# Patient Record
Sex: Male | Born: 1955 | Race: White | Hispanic: No | Marital: Married | Smoking: Former smoker
Health system: Southern US, Community
[De-identification: ages and names within clinical notes are randomized; demographics above are authoritative.]

## PROBLEM LIST (undated history)

## (undated) DIAGNOSIS — E538 Deficiency of other specified B group vitamins: Secondary | ICD-10-CM

## (undated) DIAGNOSIS — I209 Angina pectoris, unspecified: Secondary | ICD-10-CM

## (undated) DIAGNOSIS — G2581 Restless legs syndrome: Secondary | ICD-10-CM

## (undated) DIAGNOSIS — I10 Essential (primary) hypertension: Secondary | ICD-10-CM

## (undated) DIAGNOSIS — I499 Cardiac arrhythmia, unspecified: Secondary | ICD-10-CM

## (undated) DIAGNOSIS — E785 Hyperlipidemia, unspecified: Secondary | ICD-10-CM

## (undated) DIAGNOSIS — M479 Spondylosis, unspecified: Secondary | ICD-10-CM

## (undated) DIAGNOSIS — J302 Other seasonal allergic rhinitis: Secondary | ICD-10-CM

## (undated) DIAGNOSIS — IMO0001 Reserved for inherently not codable concepts without codable children: Secondary | ICD-10-CM

## (undated) DIAGNOSIS — E669 Obesity, unspecified: Secondary | ICD-10-CM

## (undated) DIAGNOSIS — M1990 Unspecified osteoarthritis, unspecified site: Secondary | ICD-10-CM

## (undated) DIAGNOSIS — G541 Lumbosacral plexus disorders: Secondary | ICD-10-CM

## (undated) DIAGNOSIS — IMO0002 Reserved for concepts with insufficient information to code with codable children: Secondary | ICD-10-CM

## (undated) DIAGNOSIS — R7303 Prediabetes: Secondary | ICD-10-CM

## (undated) DIAGNOSIS — K219 Gastro-esophageal reflux disease without esophagitis: Secondary | ICD-10-CM

## (undated) DIAGNOSIS — M79606 Pain in leg, unspecified: Secondary | ICD-10-CM

## (undated) DIAGNOSIS — G4730 Sleep apnea, unspecified: Secondary | ICD-10-CM

## (undated) DIAGNOSIS — R3989 Other symptoms and signs involving the genitourinary system: Secondary | ICD-10-CM

## (undated) DIAGNOSIS — G4733 Obstructive sleep apnea (adult) (pediatric): Secondary | ICD-10-CM

## (undated) DIAGNOSIS — C801 Malignant (primary) neoplasm, unspecified: Secondary | ICD-10-CM

## (undated) DIAGNOSIS — C9110 Chronic lymphocytic leukemia of B-cell type not having achieved remission: Secondary | ICD-10-CM

## (undated) DIAGNOSIS — R37 Sexual dysfunction, unspecified: Secondary | ICD-10-CM

## (undated) HISTORY — DX: Chronic lymphocytic leukemia of B-cell type not having achieved remission: C91.10

## (undated) HISTORY — DX: Lumbosacral plexus disorders: G54.1

## (undated) HISTORY — DX: Pain in leg, unspecified: M79.606

## (undated) HISTORY — DX: Deficiency of other specified B group vitamins: E53.8

## (undated) HISTORY — DX: Hyperlipidemia, unspecified: E78.5

## (undated) HISTORY — DX: Unspecified osteoarthritis, unspecified site: M19.90

## (undated) HISTORY — DX: Other seasonal allergic rhinitis: J30.2

## (undated) HISTORY — PX: NOSE SURGERY: SHX723

## (undated) HISTORY — PX: ANAL FISSURE REPAIR: SHX2312

## (undated) HISTORY — DX: Obstructive sleep apnea (adult) (pediatric): G47.33

## (undated) HISTORY — DX: Essential (primary) hypertension: I10

## (undated) HISTORY — DX: Malignant (primary) neoplasm, unspecified: C80.1

## (undated) HISTORY — PX: SKIN SURGERY: SHX2413

## (undated) HISTORY — DX: Reserved for inherently not codable concepts without codable children: IMO0001

## (undated) HISTORY — DX: Reserved for concepts with insufficient information to code with codable children: IMO0002

## (undated) HISTORY — DX: Sleep apnea, unspecified: G47.30

## (undated) HISTORY — PX: SKIN CANCER EXCISION: SHX779

## (undated) HISTORY — DX: Obesity, unspecified: E66.9

## (undated) HISTORY — DX: Spondylosis, unspecified: M47.9

## (undated) HISTORY — DX: Sexual dysfunction, unspecified: R37

## (undated) HISTORY — DX: Gastro-esophageal reflux disease without esophagitis: K21.9

## (undated) HISTORY — DX: Other symptoms and signs involving the genitourinary system: R39.89

## (undated) HISTORY — DX: Restless legs syndrome: G25.81

---

## 2001-05-24 ENCOUNTER — Other Ambulatory Visit: Admission: RE | Admit: 2001-05-24 | Discharge: 2001-05-24 | Payer: Self-pay | Admitting: Oncology

## 2001-05-28 DIAGNOSIS — C9110 Chronic lymphocytic leukemia of B-cell type not having achieved remission: Secondary | ICD-10-CM | POA: Insufficient documentation

## 2001-05-28 HISTORY — DX: Chronic lymphocytic leukemia of B-cell type not having achieved remission: C91.10

## 2004-06-08 ENCOUNTER — Ambulatory Visit: Payer: Self-pay | Admitting: Oncology

## 2004-12-16 ENCOUNTER — Ambulatory Visit: Payer: Self-pay | Admitting: Oncology

## 2005-06-02 ENCOUNTER — Ambulatory Visit: Payer: Self-pay | Admitting: Oncology

## 2005-11-17 ENCOUNTER — Ambulatory Visit: Payer: Self-pay | Admitting: Oncology

## 2006-05-04 ENCOUNTER — Ambulatory Visit: Payer: Self-pay | Admitting: Oncology

## 2006-10-19 ENCOUNTER — Ambulatory Visit: Payer: Self-pay | Admitting: Oncology

## 2009-10-05 ENCOUNTER — Ambulatory Visit: Payer: Self-pay | Admitting: Cardiovascular Disease

## 2009-10-12 ENCOUNTER — Telehealth (INDEPENDENT_AMBULATORY_CARE_PROVIDER_SITE_OTHER): Payer: Self-pay | Admitting: *Deleted

## 2009-10-13 ENCOUNTER — Ambulatory Visit: Payer: Self-pay | Admitting: Cardiology

## 2009-10-13 ENCOUNTER — Telehealth (INDEPENDENT_AMBULATORY_CARE_PROVIDER_SITE_OTHER): Payer: Self-pay | Admitting: *Deleted

## 2009-10-13 ENCOUNTER — Ambulatory Visit: Payer: Self-pay

## 2009-10-13 ENCOUNTER — Encounter (HOSPITAL_COMMUNITY): Admission: RE | Admit: 2009-10-13 | Discharge: 2009-12-01 | Payer: Self-pay | Admitting: Cardiovascular Disease

## 2009-10-14 ENCOUNTER — Ambulatory Visit: Payer: Self-pay

## 2009-10-14 ENCOUNTER — Encounter: Payer: Self-pay | Admitting: Cardiovascular Disease

## 2010-08-02 NOTE — Assessment & Plan Note (Signed)
Summary: Cardiology Nuclear Study  Nuclear Med Background Indications for Stress Test: Evaluation for Ischemia    History Comments: (6-7 yrs ago) GXT: (-) per Pt.  Symptoms: Chest Pain, Chest Pain with Exertion, DOE    Nuclear Pre-Procedure Cardiac Risk Factors: Hypertension, Lipids, Obesity Caffeine/Decaff Intake: None NPO After: 7:00 PM Lungs: clear IV 0.9% NS with Angio Cath: 22g     IV Site: (L) AC IV Started by: Irean Hong RN Chest Size (in) 56     Height (in): 75 Weight (lb): 348 BMI: 43.65  Nuclear Med Study 1 or 2 day study:  1 day     Stress Test Type:  Stress Reading MD:  Olga Millers, MD     Referring MD:  S.Allen Resting Radionuclide:  Technetium 33m Tetrofosmin     Resting Radionuclide Dose:  33 mCi  Stress Radionuclide:  Technetium 36m Tetrofosmin     Stress Radionuclide Dose:  33 mCi   Stress Protocol Exercise Time (min):  6:45 min     Max HR:  153 bpm     Predicted Max HR:  169 bpm  Max Systolic BP: 179 mm Hg     Percent Max HR:  90.53 %     METS: 8.0 Rate Pressure Product:  47829    Stress Test Technologist:  Milana Na EMT-P     Nuclear Technologist:  Domenic Polite CNMT  Rest Procedure  Myocardial perfusion imaging was performed at rest 45 minutes following the intravenous administration of Myoview Technetium 21m Tetrofosmin.  Stress Procedure  The patient exercised for 6:45. The patient stopped due to fatigue, doe, and denied any chest pain.  There were no significant ST-T wave changes.  Myoview was injected at peak exercise and myocardial perfusion imaging was performed after a brief delay.  QPS Raw Data Images:  There is no interference from other nuclear activity. Stress Images:  There is decreased uptake in the inferior wall. Rest Images:  There is decreased uptake in the inferior wall less prominent compared to the stress images. Subtraction (SDS):  These findings are consistent with inferior thinning and mild inferior  ischemia. Transient Ischemic Dilatation:  .86  (Normal <1.22)  Lung/Heart Ratio:  .43  (Normal <0.45)  Quantitative Gated Spect Images QGS EDV:  153 ml QGS ESV:  60 ml QGS EF:  61 % QGS cine images:  Normal wall motion; mild left ventricular enlargement.   Overall Impression  Exercise Capacity: Fair exercise capacity. BP Response: Normal blood pressure response. Clinical Symptoms: There is dyspnea. ECG Impression: No significant ST segment change suggestive of ischemia. Overall Impression: Low risk stress nuclear study with inferior thinning and mild inferior ischemia.

## 2010-08-02 NOTE — Progress Notes (Signed)
----   Converted from flag ---- ---- 10/12/2009 4:49 PM, Marilynne Halsted, CMA, AAMA wrote: Richardson Landry TEST 10-13-09, [employer], MED[employer]ATCH APPROVED CASE#[id number], EXP 10-27-09 ------------------------------

## 2010-08-02 NOTE — Progress Notes (Signed)
Summary: Nuclear Pre-Procedure  Phone Note Outgoing Call Call back at [facility] Phone [phone]   Call placed by: Stanton Kidney, EMT-P,  October 12, 2009 3:04 PM Action Taken: Phone Call Completed Summary of Call: Left message with information on Myoview Information Sheet (see scanned document for details).     Nuclear Med Background Indications for Stress Test: Evaluation for Ischemia    History Comments: (6-7 yrs ago) GXT: (-) per Pt.  Symptoms: Chest Pain, Chest Pain with Exertion    Nuclear Pre-Procedure Cardiac Risk Factors: Hypertension, Lipids, Obesity

## 2010-11-15 NOTE — Letter (Signed)
October 05, 2009    Lucila Maine, MD.  [facility].  [address].  [place], [place], [address].   RE:  SHERON, ROBIN  MRN:  [id number]  /  DOB:  [birth year]   Dear Dr. Lorin Picket:   I had the pleasure of seeing Mr. Lierman in clinic this afternoon.  As  you know, he is a 55 year old male with multiple risk factors for  coronary disease, who has been experiencing some intermittent exertional  chest discomfort that has mostly atypical characteristics.  However, the  fact that it is exertional raises a concern for obstructive coronary  disease.  We will proceed with an exercise Cardiolite study in order to  rule out inducible ischemia.  I also spoke with the patient at length  about lifestyle modifications that would be certainly beneficial to him.  He appears motivated as he has recently lost 11 pounds through dietary  modification.  I will let you know the result of the stress test.  I  look forward to following this patient along with you and please contact  my office if I can be of further assistance.    Sincerely,      Brayton El, MD  Electronically Signed    SGA/MedQ  DD: 10/05/2009  DT: 10/05/2009  Job #: [id number]

## 2010-11-15 NOTE — Assessment & Plan Note (Signed)
[facility]                        [facility] OFFICE NOTE   NAME:Justin Cross, Justin Cross                   MRN:          [id number]  DATE:10/05/2009                            DOB:          10-Sep-1955    CHIEF COMPLAINT:  Chest pain.   HISTORY OF PRESENT ILLNESS:  Justin Cross is a 55 year old white male with  past medical history significant for CLL, hypertension, dyslipidemia,  obesity, GERD, who is presenting with 59-month history of chest  discomfort.  For the past several months, he has had some substernal  chest discomfort that usually occurs with exertion and relieved by a  burping.  He believes that it is related to him eating certain types of  foods and drinking carbonated drinks.  The chest discomfort does not  radiate and does not have any associated symptoms.  He walks with his  dog 15-20 minutes daily and has not noticed any change in dyspnea,  although his physical conditioning is suboptimal.  He does not have any  chest discomfort at rest.  He states he had a negative stress test 6 or  7 years ago.  Other than the chest discomfort, the patient has been  getting along fairly well.  He has lost 11 pounds in the past month or 2  by making any changes to his dietary intake.   PAST MEDICAL HISTORY:  As above in the HPI.   SOCIAL HISTORY:  No tobacco, no alcohol.   FAMILY HISTORY:  Negative for premature coronary artery disease.   ALLERGIES:  DAYPRO causes rash.   MEDICATIONS:  1. Aspirin 162 mg occasionally.  2. Pravastatin 40 mg p.o. nightly.  3. Diovan HCT 160/12.5 p.o. daily.  4. Omeprazole 40 mg daily.  5. Vitamin C.  6. Benefiber.  7. Metaxalone.  8. Flax seed.  9. Zinc.  10.Azelastine nasal spray.   REVIEW OF SYSTEMS:  As in HPI.  He also endorses occasional anxiety for  which he takes alprazolam.  Other systems as in HPI, otherwise negative.   PHYSICAL EXAMINATION:  VITAL SIGNS:  Blood pressure 112/75, pulse 74,  sating 96% on room air, and he weighs 350.  GENERAL:  No acute distress.  HEENT:  Normocephalic and atraumatic.  NECK:  Supple.  There is no JVD.  There are no carotid bruits.  HEART:  Regular rate and rhythm without murmur or gallop.  LUNGS:  Clear bilaterally.  ABDOMEN:  Soft, nontender, and nondistended.  EXTREMITIES:  Without edema.  SKIN:  Warm and dry.  PSYCH:  The patient is appropriate with normal levels of insight.  NEURO:  Appears nonfocal.  MUSCULOSKELETAL:  5/5 bilateral upper and lower extremity strength.   EKG taken today in clinic independently interpreted myself demonstrates  normal sinus rhythm with some nonspecific T-wave abnormalities.   LABORATORY DATA:  Review of his CMP is completely within normal limits  except for an elevated glucose at 117, his creatinine was 0.9.  Review  of his CBC showed white count of 26.2, hemoglobin of 15, hematocrit of  46, and platelet count 193.  Review of the cholesterol, total  cholesterol 207, HDL  27, triglycerides 210, and LDL 138.   ASSESSMENT:  A 55 year old white male with hypertension, hyperlipidemia,  obesity, presenting with atypical chest discomfort.  I believe it is  more likely that the discomfort is related to his diagnosis of reflux  and dyspepsia; however, the exertional component does raise concern for  coronary artery disease.  His blood pressure is currently well  controlled.   PLAN:  We will proceed with an exercise Cardiolite to rule out inducible  ischemia and to ensure normal left ventricular systolic function.  The  patient may continue on his above medications.  The patient is counseled  at length regarding beneficial changes that could be made to his diet  and he seems willing to incorporate these.  We will contact the patient  after results of his stress test are obtained.     Brayton El, MD  Electronically Signed    SGA/MedQ  DD: 10/05/2009  DT: 10/06/2009  Job #: [id number]

## 2011-03-23 ENCOUNTER — Encounter: Payer: Self-pay | Admitting: Cardiovascular Disease

## 2015-07-16 DIAGNOSIS — C9110 Chronic lymphocytic leukemia of B-cell type not having achieved remission: Secondary | ICD-10-CM

## 2016-07-14 DIAGNOSIS — N503 Cyst of epididymis: Secondary | ICD-10-CM

## 2016-07-14 DIAGNOSIS — C9110 Chronic lymphocytic leukemia of B-cell type not having achieved remission: Secondary | ICD-10-CM

## 2017-01-19 DIAGNOSIS — C9110 Chronic lymphocytic leukemia of B-cell type not having achieved remission: Secondary | ICD-10-CM | POA: Diagnosis not present

## 2017-01-19 DIAGNOSIS — R590 Localized enlarged lymph nodes: Secondary | ICD-10-CM | POA: Diagnosis not present

## 2017-07-27 DIAGNOSIS — C9110 Chronic lymphocytic leukemia of B-cell type not having achieved remission: Secondary | ICD-10-CM | POA: Diagnosis not present

## 2017-10-22 DIAGNOSIS — D485 Neoplasm of uncertain behavior of skin: Secondary | ICD-10-CM | POA: Diagnosis not present

## 2017-10-22 DIAGNOSIS — L570 Actinic keratosis: Secondary | ICD-10-CM | POA: Diagnosis not present

## 2017-10-22 DIAGNOSIS — D2239 Melanocytic nevi of other parts of face: Secondary | ICD-10-CM | POA: Diagnosis not present

## 2017-10-22 DIAGNOSIS — Z125 Encounter for screening for malignant neoplasm of prostate: Secondary | ICD-10-CM | POA: Diagnosis not present

## 2017-10-22 DIAGNOSIS — Z0000 Encounter for general adult medical examination without abnormal findings: Secondary | ICD-10-CM | POA: Diagnosis not present

## 2017-10-22 DIAGNOSIS — C44319 Basal cell carcinoma of skin of other parts of face: Secondary | ICD-10-CM | POA: Diagnosis not present

## 2017-10-22 DIAGNOSIS — E782 Mixed hyperlipidemia: Secondary | ICD-10-CM | POA: Diagnosis not present

## 2017-10-22 DIAGNOSIS — L578 Other skin changes due to chronic exposure to nonionizing radiation: Secondary | ICD-10-CM | POA: Diagnosis not present

## 2017-10-22 DIAGNOSIS — D225 Melanocytic nevi of trunk: Secondary | ICD-10-CM | POA: Diagnosis not present

## 2017-10-25 DIAGNOSIS — C9190 Lymphoid leukemia, unspecified not having achieved remission: Secondary | ICD-10-CM | POA: Diagnosis not present

## 2017-10-25 DIAGNOSIS — E119 Type 2 diabetes mellitus without complications: Secondary | ICD-10-CM | POA: Diagnosis not present

## 2017-11-06 DIAGNOSIS — C44519 Basal cell carcinoma of skin of other part of trunk: Secondary | ICD-10-CM | POA: Diagnosis not present

## 2017-12-10 DIAGNOSIS — C44712 Basal cell carcinoma of skin of right lower limb, including hip: Secondary | ICD-10-CM | POA: Diagnosis not present

## 2017-12-10 DIAGNOSIS — C44519 Basal cell carcinoma of skin of other part of trunk: Secondary | ICD-10-CM | POA: Diagnosis not present

## 2018-01-01 DIAGNOSIS — L03114 Cellulitis of left upper limb: Secondary | ICD-10-CM | POA: Diagnosis not present

## 2018-02-01 DIAGNOSIS — C9110 Chronic lymphocytic leukemia of B-cell type not having achieved remission: Secondary | ICD-10-CM | POA: Diagnosis not present

## 2018-04-26 DIAGNOSIS — R7301 Impaired fasting glucose: Secondary | ICD-10-CM | POA: Diagnosis not present

## 2018-04-29 DIAGNOSIS — D225 Melanocytic nevi of trunk: Secondary | ICD-10-CM | POA: Diagnosis not present

## 2018-04-29 DIAGNOSIS — E119 Type 2 diabetes mellitus without complications: Secondary | ICD-10-CM | POA: Diagnosis not present

## 2018-04-29 DIAGNOSIS — D2239 Melanocytic nevi of other parts of face: Secondary | ICD-10-CM | POA: Diagnosis not present

## 2018-04-29 DIAGNOSIS — C44619 Basal cell carcinoma of skin of left upper limb, including shoulder: Secondary | ICD-10-CM | POA: Diagnosis not present

## 2018-04-29 DIAGNOSIS — Z6839 Body mass index (BMI) 39.0-39.9, adult: Secondary | ICD-10-CM | POA: Diagnosis not present

## 2018-04-29 DIAGNOSIS — L570 Actinic keratosis: Secondary | ICD-10-CM | POA: Diagnosis not present

## 2018-04-29 DIAGNOSIS — L814 Other melanin hyperpigmentation: Secondary | ICD-10-CM | POA: Diagnosis not present

## 2018-05-23 DIAGNOSIS — C44619 Basal cell carcinoma of skin of left upper limb, including shoulder: Secondary | ICD-10-CM | POA: Diagnosis not present

## 2018-06-13 DIAGNOSIS — J069 Acute upper respiratory infection, unspecified: Secondary | ICD-10-CM | POA: Diagnosis not present

## 2018-06-13 DIAGNOSIS — Z6841 Body Mass Index (BMI) 40.0 and over, adult: Secondary | ICD-10-CM | POA: Diagnosis not present

## 2018-06-13 DIAGNOSIS — B9789 Other viral agents as the cause of diseases classified elsewhere: Secondary | ICD-10-CM | POA: Diagnosis not present

## 2018-08-05 DIAGNOSIS — C9110 Chronic lymphocytic leukemia of B-cell type not having achieved remission: Secondary | ICD-10-CM | POA: Diagnosis not present

## 2018-08-08 DIAGNOSIS — J40 Bronchitis, not specified as acute or chronic: Secondary | ICD-10-CM | POA: Diagnosis not present

## 2018-08-08 DIAGNOSIS — K219 Gastro-esophageal reflux disease without esophagitis: Secondary | ICD-10-CM | POA: Diagnosis not present

## 2018-08-08 DIAGNOSIS — Z6841 Body Mass Index (BMI) 40.0 and over, adult: Secondary | ICD-10-CM | POA: Diagnosis not present

## 2018-08-08 DIAGNOSIS — J329 Chronic sinusitis, unspecified: Secondary | ICD-10-CM | POA: Diagnosis not present

## 2018-10-28 DIAGNOSIS — Z125 Encounter for screening for malignant neoplasm of prostate: Secondary | ICD-10-CM | POA: Diagnosis not present

## 2018-10-28 DIAGNOSIS — Z131 Encounter for screening for diabetes mellitus: Secondary | ICD-10-CM | POA: Diagnosis not present

## 2018-10-28 DIAGNOSIS — Z0000 Encounter for general adult medical examination without abnormal findings: Secondary | ICD-10-CM | POA: Diagnosis not present

## 2018-10-28 DIAGNOSIS — Z13220 Encounter for screening for lipoid disorders: Secondary | ICD-10-CM | POA: Diagnosis not present

## 2019-11-06 DIAGNOSIS — C9110 Chronic lymphocytic leukemia of B-cell type not having achieved remission: Secondary | ICD-10-CM

## 2019-12-30 ENCOUNTER — Encounter: Payer: Self-pay | Admitting: Neurology

## 2019-12-30 ENCOUNTER — Ambulatory Visit: Payer: Commercial Managed Care - PPO | Admitting: Neurology

## 2019-12-30 ENCOUNTER — Other Ambulatory Visit: Payer: Self-pay

## 2019-12-30 VITALS — BP 120/71 | HR 64 | Ht 74.5 in | Wt 327.0 lb

## 2019-12-30 DIAGNOSIS — G8929 Other chronic pain: Secondary | ICD-10-CM | POA: Diagnosis not present

## 2019-12-30 DIAGNOSIS — M62838 Other muscle spasm: Secondary | ICD-10-CM

## 2019-12-30 DIAGNOSIS — G2581 Restless legs syndrome: Secondary | ICD-10-CM | POA: Diagnosis not present

## 2019-12-30 DIAGNOSIS — M5441 Lumbago with sciatica, right side: Secondary | ICD-10-CM

## 2019-12-30 DIAGNOSIS — M5440 Lumbago with sciatica, unspecified side: Secondary | ICD-10-CM

## 2019-12-30 HISTORY — DX: Other muscle spasm: M62.838

## 2019-12-30 HISTORY — DX: Lumbago with sciatica, right side: M54.41

## 2019-12-30 HISTORY — DX: Other chronic pain: G89.29

## 2019-12-30 MED ORDER — GABAPENTIN 100 MG PO CAPS: 300.0000 mg | ORAL_CAPSULE | Freq: Every day | ORAL | 11 refills | Status: DC

## 2019-12-30 MED ORDER — ALPRAZOLAM 1 MG PO TABS: ORAL_TABLET | ORAL | 0 refills | Status: DC

## 2019-12-30 NOTE — Progress Notes (Signed)
HISTORICAL  Justin Cross is a 64 year old male, seen in request by his primary care physician Dr. Venetia Maxon, Harrell Gave for evaluation of bilateral lower extremity deep achy pain, muscle spasm.  Initial evaluation was on December 30, 2019  I reviewed and summarized the referring note. He has past medical history of HTN HLD has been taking pravstatin 40mg  qhs for more than 10 years, He also carries a diagnosis of chronic lymphoblastic leukemia, it was not incidental finding by laboratory evaluation more than 15 years ago, he has enlarged cervical lymph node, never received any treatment  History of left lumbosacral plexopathy pain 2003, he presented with acute onset left lower extremity deep achy pain, muscle weakness, the diagnosis was confirmed by electrodiagnostic study by Dr. Metta Clines, his left lower extremity weakness last about 6 months, now he regained most of the function  He reported long history of intermittent lower extremity deep achy pain, symptom onset was more than 20 years ago, after prolonged sitting on the couch, when he got up, he had a sudden onset severe achiness at left inner thigh muscle, is different from the muscle spasm, he has play sports when he was young, he described muscle spasm as sudden onset muscle tightness as a knot, ultimately improved by stretching, he did experience frequent muscle spasm involving bilateral calf, inner thigh muscle, and posterior neck muscle in the past  This particular pain was described as it did achiness, he has to bend over, to wait for [place] to pass, is usually last about 10 minutes, since the initial event, he had frequent similar recurrence, involving different area of lower extremity, oftentimes it is bilateral inner thigh area, posterior upper thigh, lateral leg,  He denies persistent sensory loss, gait abnormality, no bowel and bladder incontinence, for a while, he was having 1 spell at least once every night, he was put on gabapentin  around 2011, in the yard for extended period of time, playing golf, dehydration, his symptoms quickly improved by taking 300 mg every night, higher dose would cause excessive drowsiness  His symptoms overall is under reasonable management until he received Kenalog shot in May 2021, he began to have significant flareup of bilateral lower extremity intermittent deep aching episode, wake him up again almost every night, he tried higher dose of gabapentin, could not not tolerated due to sleepiness  He does have morbid obesity, chronic low back pain,    REVIEW OF SYSTEMS: Full 14 system review of systems performed and notable only for as above All other review of systems were negative.  ALLERGIES: Allergies  Allergen Reactions   Daypro [Oxaprozin] Rash    HOME MEDICATIONS: Current Outpatient Medications  Medication Sig Dispense Refill   Ascorbic Acid (VITAMIN C PO) Take by mouth.       AZELASTINE HCL NA Place into the nose.       Flaxseed, Linseed, (FLAX SEEDS PO) Take by mouth.       gabapentin (NEURONTIN) 300 MG capsule Take 300 mg by mouth 3 (three) times daily. 12/31/19 - He always takes 2 capsules QHS and occasionally one extra capsule in the evenings.     METAXALONE PO Take by mouth.       Multiple Vitamins-Minerals (ZINC PO) Take by mouth.       omeprazole (PRILOSEC) 40 MG capsule Take 40 mg by mouth daily.       pravastatin (PRAVACHOL) 40 MG tablet Take 40 mg by mouth daily.       traMADol (ULTRAM) 50  MG tablet Take 50 mg by mouth as needed.     valsartan-hydrochlorothiazide (DIOVAN-HCT) 160-12.5 MG per tablet Take 1 tablet by mouth daily.       Wheat Dextrin (BENEFIBER PO) Take by mouth.       No current facility-administered medications for this visit.    PAST MEDICAL HISTORY: Past Medical History:  Diagnosis Date   B12 deficiency    Cancer ([place])    Chest pain    CLL (chronic lymphoblastic leukemia)    Constipation    Diabetes mellitus    Dyslipidemia     GERD (gastroesophageal reflux disease)    High blood pressure    Hyperlipidemia    Leg pain    Lumbosacral plexopathy    12/30/19 - estimates 18-20 years   Obesity    OSA (obstructive sleep apnea)    Reflux    Restless leg    Seasonal allergies    Hay fever   Sexual dysfunction    Sleep apnea    Ulcer    Urinary problem     PAST SURGICAL HISTORY: Past Surgical History:  Procedure Laterality Date   [place]?   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   Left-handed.   Caffeine: 1/3 of a 2-liter bottle of Diet Mountain Dew per day.   Social Determinants of Health   Financial Resource Strain:    Difficulty of Paying Living Expenses:   Food Insecurity:    Worried About Charity fundraiser in the Last Year:    Arboriculturist in the Last Year:   Transportation Needs:    Film/video editor (Medical):    Lack of Transportation (Non-Medical):   Physical Activity:    Days of Exercise per Week:    Minutes of Exercise per Session:   Stress:    Feeling of Stress :   Social Connections:    Frequency of Communication with Friends and  Family:    Frequency of Social Gatherings with Friends and Family:    Attends Religious Services:    Active Member of Clubs or Organizations:    Attends Archivist Meetings:    Marital Status:   Intimate Partner Violence:    Fear of Current or Ex-Partner:    Emotionally Abused:    Physically Abused:    Sexually Abused:      PHYSICAL EXAM   Vitals:   12/30/19 1415  BP: 120/71  Pulse: 64  Weight: (!) 327 lb (148.3 kg)  Height: 6' 2.5" (1.892 m)  Not recorded     Body mass index is 41.42 kg/m.  PHYSICAL EXAMNIATION:  Gen: NAD, conversant, well nourised, well groomed                     Cardiovascular: Regular rate rhythm, no peripheral edema, warm, nontender. Eyes: Conjunctivae clear without exudates or hemorrhage Neck: Supple, no carotid bruits. Pulmonary: Clear to auscultation bilaterally   NEUROLOGICAL EXAM:  MENTAL STATUS: Speech:    Speech is normal; fluent and spontaneous with normal comprehension.  Cognition:     Orientation to time, place and person     Normal recent and remote memory     Normal Attention span and concentration     Normal Language, naming, repeating,spontaneous speech     Fund of knowledge   CRANIAL NERVES: CN II: Visual fields are full to confrontation. Pupils are round equal and briskly reactive to light. CN III, IV, VI: extraocular movement are normal. No ptosis. CN V: Facial sensation is intact to light touch CN VII: Face is symmetric with normal eye closure  CN VIII: Hearing is normal to causal conversation. CN IX, X: Phonation is normal. CN XI: Head turning and shoulder shrug are intact  MOTOR: There is no pronator drift of out-stretched arms. Muscle bulk and tone are normal. Muscle strength is normal.  REFLEXES: Reflexes are 2+ and symmetric at the biceps, triceps, knees, and ankles. Plantar responses are flexor.  SENSORY: Intact to light touch, pinprick and vibratory sensation are intact in fingers and  toes.  COORDINATION: There is no trunk or limb dysmetria noted.  GAIT/STANCE: Steady, but limited due to his body habitus   DIAGNOSTIC DATA (LABS, IMAGING, TESTING) - I reviewed patient records, labs, notes, testing and imaging myself where available.   ASSESSMENT AND PLAN  Justin Cross is a 64 y.o. male   Intermittent bilateral lower extremity muscle achy pain, Chronic low back pain  Differentiation diagnosis include bilateral lumbosacral radiculopathy, versus musculoskeletal etiology, electrolyte imbalance  Laboratory evaluations  MRI of lumbar spine  May take higher dose of gabapentin -100 mg prescription, adding on his 300 mg tablets   Marcial Pacas, M.D. Ph.D.  [facility] [address], [place], [place] [address] Ph: [phone] Fax: [phone]  CC:  Street, Sharon Mt, MD

## 2019-12-31 LAB — COMPREHENSIVE METABOLIC PANEL
ALT: 22 IU/L (ref 0–44)
AST: 20 IU/L (ref 0–40)
Albumin/Globulin Ratio: 2.6 — ABNORMAL HIGH (ref 1.2–2.2)
Albumin: 4.6 g/dL (ref 3.8–4.8)
Alkaline Phosphatase: 48 IU/L (ref 48–121)
BUN/Creatinine Ratio: 13 (ref 10–24)
BUN: 14 mg/dL (ref 8–27)
Bilirubin Total: 0.5 mg/dL (ref 0.0–1.2)
CO2: 24 mmol/L (ref 20–29)
Calcium: 9 mg/dL (ref 8.6–10.2)
Chloride: 101 mmol/L (ref 96–106)
Creatinine, Ser: 1.06 mg/dL (ref 0.76–1.27)
GFR calc Af Amer: 87 mL/min/{1.73_m2} (ref >59)
GFR calc non Af Amer: 75 mL/min/{1.73_m2} (ref >59)
Globulin, Total: 1.8 g/dL (ref 1.5–4.5)
Glucose: 125 mg/dL — ABNORMAL HIGH (ref 65–99)
Potassium: 4 mmol/L (ref 3.5–5.2)
Sodium: 138 mmol/L (ref 134–144)
Total Protein: 6.4 g/dL (ref 6.0–8.5)

## 2019-12-31 LAB — CBC WITH DIFFERENTIAL
Basophils Absolute: 0.1 10*3/uL (ref 0.0–0.2)
Basos: 0 %
EOS (ABSOLUTE): 0.1 10*3/uL (ref 0.0–0.4)
Eos: 0 %
Hematocrit: 44.8 % (ref 37.5–51.0)
Hemoglobin: 14.9 g/dL (ref 13.0–17.7)
Immature Grans (Abs): 0 10*3/uL (ref 0.0–0.1)
Immature Granulocytes: 0 %
Lymphocytes Absolute: 22.2 10*3/uL — ABNORMAL HIGH (ref 0.7–3.1)
Lymphs: 76 %
MCH: 29.6 pg (ref 26.6–33.0)
MCHC: 33.3 g/dL (ref 31.5–35.7)
MCV: 89 fL (ref 79–97)
Monocytes Absolute: 0.8 10*3/uL (ref 0.1–0.9)
Monocytes: 3 %
Neutrophils Absolute: 6.2 10*3/uL (ref 1.4–7.0)
Neutrophils: 21 %
RBC: 5.04 x10E6/uL (ref 4.14–5.80)
RDW: 13 % (ref 11.6–15.4)
WBC: 29.4 10*3/uL (ref 3.4–10.8)

## 2019-12-31 LAB — ANA W/REFLEX IF POSITIVE
Anti JO-1: <0.2 AI (ref 0.0–0.9)
Anti Nuclear Antibody (ANA): POSITIVE — AB
Centromere Ab Screen: <0.2 AI (ref 0.0–0.9)
Chromatin Ab SerPl-aCnc: <0.2 AI (ref 0.0–0.9)
ENA RNP Ab: 1.1 AI — ABNORMAL HIGH (ref 0.0–0.9)
ENA SM Ab Ser-aCnc: <0.2 AI (ref 0.0–0.9)
ENA SSA (RO) Ab: <0.2 AI (ref 0.0–0.9)
ENA SSB (LA) Ab: <0.2 AI (ref 0.0–0.9)
Scleroderma (Scl-70) (ENA) Antibody, IgG: <0.2 AI (ref 0.0–0.9)
dsDNA Ab: 3 IU/mL (ref 0–9)

## 2019-12-31 LAB — C-REACTIVE PROTEIN: CRP: 1 mg/L (ref 0–10)

## 2019-12-31 LAB — FERRITIN: Ferritin: 74 ng/mL (ref 30–400)

## 2019-12-31 LAB — TSH: TSH: 1.99 u[IU]/mL (ref 0.450–4.500)

## 2019-12-31 LAB — RPR: RPR Ser Ql: NONREACTIVE

## 2019-12-31 LAB — VITAMIN B12: Vitamin B-12: 498 pg/mL (ref 232–1245)

## 2019-12-31 LAB — FOLATE: Folate: >20 ng/mL (ref >3.0)

## 2019-12-31 LAB — SEDIMENTATION RATE: Sed Rate: 2 mm/hr (ref 0–30)

## 2019-12-31 LAB — HIV ANTIBODY (ROUTINE TESTING W REFLEX): HIV Screen 4th Generation wRfx: NONREACTIVE

## 2019-12-31 LAB — HGB A1C W/O EAG: Hgb A1c MFr Bld: 6.7 % — ABNORMAL HIGH (ref 4.8–5.6)

## 2019-12-31 LAB — CK: Total CK: 227 U/L (ref 41–331)

## 2020-01-01 ENCOUNTER — Telehealth: Payer: Self-pay | Admitting: Neurology

## 2020-01-01 NOTE — Telephone Encounter (Signed)
Please call patient laboratory evaluation showed  1, elevated A1c 6.7, made the diagnosis of diabetes, he should start with diet control, moderate exercise, I have faxed the laboratory evaluation to his primary care Street, Sharon Mt, MD, he should contact him for management  2.  Elevated WBC 29.4, consistent with his diagnosis of CLL, I do not have previous lab for comparison  3.  Positive ANA, with low titer ENA RNP antibody has unknown clinical significance,  Rest of the laboratory evaluation showed no significant abnormalities.

## 2020-01-01 NOTE — Telephone Encounter (Signed)
I have spoken to the patient and provided him with the lab results below. He is agreeable to watch his diet and start exercising. He is going to contact his PCP, Dr. Venetia Maxon, to make an appt for follow up.

## 2020-01-08 ENCOUNTER — Telehealth: Payer: Self-pay | Admitting: Neurology

## 2020-01-08 NOTE — Telephone Encounter (Signed)
pt scheduled for 01/14/20

## 2020-01-08 NOTE — Telephone Encounter (Signed)
open MRI  [facility] Auth: [id number] (exp. 01/08/20 to 02/06/20) order faxed to [facility] imagin they will reach out to the patient to schedule.

## 2020-01-15 ENCOUNTER — Telehealth: Payer: Self-pay | Admitting: Neurology

## 2020-01-15 NOTE — Telephone Encounter (Signed)
I received the call from [facility] radiology  Multiple T2 hyperintensity lesion at bilateral kidney, heterogeneous in intensity, recommended CT or ultrasound of abdomen for further evaluation  We will repeat and review formal MRI lumbar report before proceed

## 2020-01-15 NOTE — Telephone Encounter (Signed)
INDICATION: Other muscle spasm. Bilateral leg pain. Symptoms for years.   STUDY: MRI of the lumbar spine without intravenous contrast performed on 01/14/2020 4:38 PM.   COMPARISON: No comparison available.   TECHNIQUE: Multiplanar, multisequence MR imaging obtained through the lumbar spine without contrast on 01/14/2020 4:38 PM.   CONTRAST: None.   FINDINGS:  # Osseous structures: Small anterolateral osteophyte appreciated at L1-L2. Vertebral body heights are maintained. No acute fracture or concerning marrow signal abnormality.  # Alignment:Alignment maintained.  # Conus medullaris/cauda equina: Spinal cord signal is normal and terminates at T12-L1. The cauda equina is unremarkable.   # Lower thoracic spine: No significant spinal canal or foraminal stenosis. This is viewed on the sagittal images only.   # T12-L1: Small RIGHT paracentral disc protrusion effacing the ventral thecal sac. Foramen are patent. This is viewed on the sagittal images only.  # L1-L2: Disc desiccation and a disc bulge. There is mild effacement of the ventral thecal sac. Foramen are patent. This is viewed on the sagittal images only.  # L2-L3: Disc desiccation with a small disc bulge. There is mild effacement of the ventral thecal sac. Minimal narrowing of the foramen.  # L3-L4: Disc desiccation with a disc bulge. This produces mild spinal canal/lateral recess narrowing. Minimal narrowing of the neural foramen.  # L4-L5: Disc desiccation with a small central disc protrusion. Spinal canal and foramen are patent.  # L5-S1: Disc desiccation with a RIGHT paracentral disc osteophytic complex that abuts and displaces the traversing RIGHT S1 nerve root. Mild facet hypertrophy, greater on the RIGHT. There is mild effacement of RIGHT anterior aspect of the thecal sac and mild RIGHT neuroforaminal stenosis. The LEFT neuroforamen is patent.   # Paraspinal tissues: Unremarkable.   # Additional comments: Multiple  hyperintense T2-weighted lesions are seen in both kidneys. Of note, there are more heterogeneous on the LEFT. The largest measures 5 x 5.3 cm. Procedure Note  Acute Interface, Incoming Rad Results - 01/15/2020 10:16 AM EDT  INDICATION: Other muscle spasm. Bilateral leg pain. Symptoms for years.   STUDY: MRI of the lumbar spine without intravenous contrast performed on 01/14/2020 4:38 PM.   COMPARISON: No comparison available.   TECHNIQUE: Multiplanar, multisequence MR imaging obtained through the lumbar spine without contrast on 01/14/2020 4:38 PM.   CONTRAST: None.   FINDINGS:  # Osseous structures: Small anterolateral osteophyte appreciated at L1-L2. Vertebral body heights are maintained. No acute fracture or concerning marrow signal abnormality.  # Alignment:Alignment maintained.  # Conus medullaris/cauda equina: Spinal cord signal is normal and terminates at T12-L1. The cauda equina is unremarkable.   # Lower thoracic spine: No significant spinal canal or foraminal stenosis. This is viewed on the sagittal images only.   # T12-L1: Small RIGHT paracentral disc protrusion effacing the ventral thecal sac. Foramen are patent. This is viewed on the sagittal images only.  # L1-L2: Disc desiccation and a disc bulge. There is mild effacement of the ventral thecal sac. Foramen are patent. This is viewed on the sagittal images only.  # L2-L3: Disc desiccation with a small disc bulge. There is mild effacement of the ventral thecal sac. Minimal narrowing of the foramen.  # L3-L4: Disc desiccation with a disc bulge. This produces mild spinal canal/lateral recess narrowing. Minimal narrowing of the neural foramen.  # L4-L5: Disc desiccation with a small central disc protrusion. Spinal canal and foramen are patent.  # L5-S1: Disc desiccation with a RIGHT paracentral disc osteophytic complex that abuts and displaces  the traversing RIGHT S1 nerve root. Mild facet hypertrophy, greater on the  RIGHT. Thereis mild effacement of RIGHT anterior aspect of the thecal sac and mild RIGHT neuroforaminal stenosis. The LEFT neuroforamen is patent.   # Paraspinal tissues: Unremarkable.   # Additional comments: Multiple hyperintense T2-weighted lesions are seen in both kidneys. Of note, there are more heterogeneous on the LEFT. The largest measures 5 x 5.3 cm.    IMPRESSION:  1. 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 This is most notable for a RIGHT paracentral disc osteophytic complex at L5-S1 that produces mass effect upon the traversing RIGHT S1 nerve root.  2. Multiple hyperintense T2-weighted lesions in both kidneys that are more heterogeneous and larger on the LEFT. These do not represent simple cysts. Recommend renal ultrasound or renal CT for further evaluation.   ####CODE SIGNIFICANT REPORT####. IMPRESSION #2  (automated ordering provider notification pathway initiated at 01/15/2020 10:14 AM)     I have personally reviewed, and call patient about MRI lumbar, multilevel degenerative changes, most noticeable L5-S1, right paracentral disc herniation, with evidence of right S1 nerve root compression  Multiple hypodensity T2 at both kidney, heterogeneous, larger on the left, this does not represent simple cyst, I have suggested him further evaluation with CT of abdomen, he preferred to coordinated through his primary care, I have forward this note to his [place], Harrell Gave he will also call Dr. Venetia Maxon early next week.

## 2020-03-01 ENCOUNTER — Ambulatory Visit: Payer: Commercial Managed Care - PPO | Admitting: Neurology

## 2020-03-01 ENCOUNTER — Encounter: Payer: Self-pay | Admitting: Neurology

## 2020-03-01 ENCOUNTER — Other Ambulatory Visit: Payer: Self-pay

## 2020-03-01 VITALS — BP 125/75 | HR 66 | Ht 74.5 in | Wt 327.5 lb

## 2020-03-01 DIAGNOSIS — G8929 Other chronic pain: Secondary | ICD-10-CM

## 2020-03-01 DIAGNOSIS — G2581 Restless legs syndrome: Secondary | ICD-10-CM

## 2020-03-01 DIAGNOSIS — M5440 Lumbago with sciatica, unspecified side: Secondary | ICD-10-CM | POA: Diagnosis not present

## 2020-03-01 DIAGNOSIS — M62838 Other muscle spasm: Secondary | ICD-10-CM

## 2020-03-01 HISTORY — DX: Restless legs syndrome: G25.81

## 2020-03-01 NOTE — Progress Notes (Signed)
HISTORICAL  Justin Cross is a 64 year old male, seen in request by his primary care physician Dr. Venetia Maxon, Justin Cross for evaluation of bilateral lower extremity deep achy pain, muscle spasm.  Initial evaluation was on December 30, 2019  I reviewed and summarized the referring note. He has past medical history of HTN HLD has been taking pravstatin 110m qhs for more than 10 years, He also carries a diagnosis of chronic lymphoblastic leukemia, it was not incidental finding by laboratory evaluation more than 15 years ago, he has enlarged cervical lymph node, never received any treatment  History of left lumbosacral plexopathy in 2003, he presented with acute onset left lower extremity deep achy pain, muscle weakness, the diagnosis was confirmed by electrodiagnostic study by Dr. AMetta Cross his left lower extremity weakness last about 6 months, now he regained most of the function  He reported long history of intermittent lower extremity deep achy pain, symptom onset was more than 20 years ago, after prolonged sitting on the couch, when he got up, he had a sudden onset severe achiness at left inner thigh muscle, is different from the muscle spasm, he has play sports when he was young, he described muscle spasm as sudden onset muscle tightness as a knot, ultimately improved by stretching, he did experience frequent muscle spasm involving bilateral calf, inner thigh muscle, and posterior neck muscle in the past  This particular pain was described as it did achiness, he has to bend over, to wait for [place], is usually last about 10 minutes, since the initial event, he had frequent similar recurrence, involving different area of lower extremity, oftentimes it is bilateral inner thigh area, posterior upper thigh, lateral leg,  He denies persistent sensory loss, gait abnormality, no bowel and bladder incontinence, for a while, he was having 1 spell at least once every night, he was put on gabapentin  around 2011, in the yard for extended period of time, playing golf, dehydration, his symptoms quickly improved by taking 300 mg every night, higher dose would cause excessive drowsiness  His symptoms overall is under reasonable management until he received Kenalog shot in May 2021, he began to have significant flareup of bilateral lower extremity intermittent deep aching episode, wake him up again almost every night, he tried higher dose of gabapentin, could not not tolerated due to sleepiness  He does have morbid obesity, chronic low back pain,  UPDATE March 01 2020: He is accompanied by his wife at today's clinical visit, higher dose 7 to 800 mg every night has helped him sleep better  He complains of long history of intermittent lower extremity paresthesia, involving medium spine, lateral anterior shin level, but often triggered by prolonged sitting, certain chairs, or resting, lying down, he has the urge to move around  Since he was started on gabapentin, his symptoms has much improved, he does complains of intermittent pain, which has improved by epidural injection  We personally reviewed MRI of lumbar spine from [facility] on January 14, 2020, multilevel degenerative disc disease, most noticeable L5-S1, right paracentral disc osteophyte complex, with mass-effect upon the transversing right S1 nerve roots,  Incidental findings of multiple hyper T2 lesions in both kidney, this has led to CT of abdomen, confirmed abnormality, more appointment is pending with nephrologist  He denies persistent low back pain, denies gait abnormality, denies bowel and bladder incontinence  Laboratory evaluations in June 2021, A1c 6.7, elevated WBC 29.4, ferritin 74, normal CMP, ESR folic acid [id number] HIV CPK  TSH C-reactive protein   REVIEW OF SYSTEMS: Full 14 system review of systems performed and notable only for as above All other review of systems were negative.  ALLERGIES: Allergies  Allergen  Reactions  . Daypro [Oxaprozin] Rash    HOME MEDICATIONS: Current Outpatient Medications  Medication Sig Dispense Refill  . Ascorbic Acid (VITAMIN C PO) Take by mouth.      . AZELASTINE HCL NA Place into the nose.      . Flaxseed, Linseed, (FLAX SEEDS PO) Take by mouth.      . gabapentin (NEURONTIN) 100 MG capsule Take 3 capsules (300 mg total) by mouth at bedtime. 90 capsule 11  . gabapentin (NEURONTIN) 300 MG capsule Take 300 mg by mouth 2 (two) times daily.     Marland Kitchen METAXALONE PO Take by mouth.      . Multiple Vitamins-Minerals (ZINC PO) Take by mouth.      Marland Kitchen omeprazole (PRILOSEC) 40 MG capsule Take 40 mg by mouth daily.      . pravastatin (PRAVACHOL) 40 MG tablet Take 40 mg by mouth daily.      . traMADol (ULTRAM) 50 MG tablet Take 50 mg by mouth as needed.    . valsartan-hydrochlorothiazide (DIOVAN-HCT) 160-12.5 MG per tablet Take 1 tablet by mouth daily.      . Wheat Dextrin (BENEFIBER PO) Take by mouth.       No current facility-administered medications for this visit.    PAST MEDICAL HISTORY: Past Medical History:  Diagnosis Date  . B12 deficiency   . Cancer ([place])   . Chest pain   . CLL (chronic lymphoblastic leukemia)   . Constipation   . Diabetes mellitus   . Dyslipidemia   . GERD (gastroesophageal reflux disease)   . High blood pressure   . Hyperlipidemia   . Leg pain   . Lumbosacral plexopathy    12/30/19 - estimates 18-20 years  . Obesity   . OSA (obstructive sleep apnea)   . Reflux   . Restless leg   . Seasonal allergies    Hay fever  . Sexual dysfunction   . Sleep apnea   . Ulcer   . Urinary problem     PAST SURGICAL HISTORY: Past Surgical History:  Procedure Laterality Date  . [place]?  Marland Kitchen NOSE SURGERY  1977/78   Realignment    FAMILY HISTORY: Family History  Problem Relation Age of Onset  . Heart disease Father   . Hypertension Mother   . Diabetes Mother   . Diabetes Brother   . Coronary artery disease Neg Hx      SOCIAL HISTORY: Social History   Socioeconomic History  . Marital status: Married    Spouse name: Not on file  . Number of children: 2  . Years of education: college  . Highest education level: Associate degree: academic program  Occupational History  . 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 Smoking status: Never Smoker  . Smokeless tobacco: Never Used  Substance and Sexual Activity  . Alcohol use: No  . Drug use: Never  . Sexual activity: Not on file  Other Topics Concern  . Not on file  Social History Narrative   Lives at home with wife.   Left-handed.   Caffeine: 1/3 of a 2-liter bottle of Diet Mountain Dew per day.   Social Determinants of Health   Financial Resource Strain:   . Difficulty of  Paying Living Expenses: Not on file  Food Insecurity:   . Worried About Charity fundraiser in the Last Year: Not on file  . Ran Out of Food in the Last Year: Not on file  Transportation Needs:   . Lack of Transportation (Medical): Not on file  . Lack of Transportation (Non-Medical): Not on file  Physical Activity:   . Days of Exercise per Week: Not on file  . Minutes of Exercise per Session: Not on file  Stress:   . Feeling of Stress : Not on file  Social Connections:   . Frequency of Communication with Friends and Family: Not on file  . Frequency of Social Gatherings with Friends and Family: Not on file  . Attends Religious Services: Not on file  . Active Member of Clubs or Organizations: Not on file  . Attends Archivist Meetings: Not on file  . Marital Status: Not on file  Intimate Partner Violence:   . Fear of Current or Ex-Partner: Not on file  . Emotionally Abused: Not on file  . Physically Abused: Not on file  . Sexually Abused: Not on file     PHYSICAL EXAM   Vitals:   03/01/20 1459  BP: 125/75  Pulse: 66  Weight: (!) 327 lb 8 oz (148.6 kg)  Height: 6' 2.5" (1.892 m)   Not recorded      Body mass index is 41.49 kg/m.  PHYSICAL EXAMNIATION:  Gen: NAD, conversant, well nourised, well groomed                     Cardiovascular: Regular rate rhythm, no peripheral edema, warm, nontender. Eyes: Conjunctivae clear without exudates or hemorrhage Neck: Supple, no carotid bruits. Pulmonary: Clear to auscultation bilaterally   NEUROLOGICAL EXAM:  MENTAL STATUS: Speech/cognition: Awake alert oriented to history taking care of conversation   CRANIAL NERVES: CN II: Visual fields are full to confrontation. Pupils are round equal and briskly reactive to light. CN III, IV, VI: extraocular movement are normal. No ptosis. CN V: Facial sensation is intact to light touch CN VII: Face is symmetric with normal eye closure  CN VIII: Hearing is normal to causal conversation. CN IX, X: Phonation is normal. CN XI: Head turning and shoulder shrug are intact  MOTOR: There is no pronator drift of out-stretched arms. Muscle bulk and tone are normal. Muscle strength is normal.  REFLEXES: Reflexes are 2+ and symmetric at the biceps, triceps, knees, and absent at ankles. Plantar responses are flexor.  SENSORY: Mildly length dependent decreased to light touch, pinprick and vibratory sensation are intact in fingers and toes.  COORDINATION: There is no trunk or limb dysmetria noted.  GAIT/STANCE: He can get up from seated position arms crossed, steady, but limited due to his body habitus   DIAGNOSTIC DATA (LABS, IMAGING, TESTING) - I reviewed patient records, labs, notes, testing and imaging myself where available.   ASSESSMENT AND PLAN  Justin Cross is a 64 y.o. male   Intermittent bilateral lower extremity muscle achy pain, Restless leg symptoms Chronic low back pain  MRI showed L5-S1 right paracentral disc osteophyte complex, potential mass-effect on right S1, right lumbar radiculopathy likely explain some of his complaints of muscle cramps,  History also suggestive  of restless leg symptoms, which has much improved with gabapentin, I have suggested him to take higher dose of gabapentin, may increase to 300 mg 3 tablets at night  Incidental findings of bilateral kidney abnormalities  Further  evaluation is pending with MRI of abdomen with without contrast, and urology referral     Marcial Pacas, M.D. Ph.D.  [facility] [address], [place], [place] [address] Ph: [phone] Fax: [phone]  CC:  Street, Sharon Mt, MD

## 2020-04-01 ENCOUNTER — Other Ambulatory Visit (HOSPITAL_COMMUNITY): Payer: Self-pay | Admitting: Urology

## 2020-04-01 ENCOUNTER — Other Ambulatory Visit: Payer: Self-pay | Admitting: Urology

## 2020-04-01 DIAGNOSIS — N2889 Other specified disorders of kidney and ureter: Secondary | ICD-10-CM

## 2020-04-13 ENCOUNTER — Ambulatory Visit (HOSPITAL_COMMUNITY)
Admission: RE | Admit: 2020-04-13 | Discharge: 2020-04-13 | Disposition: A | Discharge status: Home / Self Care | Payer: Commercial Managed Care - PPO | Source: Ambulatory Visit | Attending: Urology | Admitting: Urology

## 2020-04-13 ENCOUNTER — Encounter (HOSPITAL_COMMUNITY): Payer: Self-pay

## 2020-04-13 ENCOUNTER — Other Ambulatory Visit: Payer: Self-pay

## 2020-04-13 DIAGNOSIS — N2889 Other specified disorders of kidney and ureter: Secondary | ICD-10-CM | POA: Diagnosis not present

## 2020-04-13 LAB — POCT I-STAT CREATININE: Creatinine, Ser: 1 mg/dL (ref 0.61–1.24)

## 2020-04-13 MED ORDER — IOHEXOL 300 MG/ML  SOLN
100.0000 mL | Freq: Once | INTRAMUSCULAR | Status: AC | PRN
  Administered 2020-04-13: 100 mL via INTRAVENOUS

## 2020-04-22 ENCOUNTER — Other Ambulatory Visit: Payer: Self-pay | Admitting: Urology

## 2020-05-13 ENCOUNTER — Encounter: Payer: Self-pay | Admitting: Cardiology

## 2020-05-17 ENCOUNTER — Encounter: Payer: Self-pay | Admitting: *Deleted

## 2020-05-17 ENCOUNTER — Ambulatory Visit (INDEPENDENT_AMBULATORY_CARE_PROVIDER_SITE_OTHER): Payer: Commercial Managed Care - PPO

## 2020-05-17 ENCOUNTER — Other Ambulatory Visit: Payer: Self-pay

## 2020-05-17 ENCOUNTER — Ambulatory Visit: Payer: Commercial Managed Care - PPO | Admitting: Cardiology

## 2020-05-17 VITALS — BP 108/66 | HR 74 | Ht 74.0 in | Wt 324.0 lb

## 2020-05-17 DIAGNOSIS — C801 Malignant (primary) neoplasm, unspecified: Secondary | ICD-10-CM

## 2020-05-17 DIAGNOSIS — R0789 Other chest pain: Secondary | ICD-10-CM

## 2020-05-17 DIAGNOSIS — C8330 Diffuse large B-cell lymphoma, unspecified site: Secondary | ICD-10-CM | POA: Diagnosis not present

## 2020-05-17 DIAGNOSIS — E669 Obesity, unspecified: Secondary | ICD-10-CM | POA: Insufficient documentation

## 2020-05-17 DIAGNOSIS — B2799 Infectious mononucleosis, unspecified with other complication: Secondary | ICD-10-CM

## 2020-05-17 DIAGNOSIS — Z01810 Encounter for preprocedural cardiovascular examination: Secondary | ICD-10-CM | POA: Diagnosis not present

## 2020-05-17 DIAGNOSIS — G4730 Sleep apnea, unspecified: Secondary | ICD-10-CM | POA: Insufficient documentation

## 2020-05-17 DIAGNOSIS — E785 Hyperlipidemia, unspecified: Secondary | ICD-10-CM | POA: Insufficient documentation

## 2020-05-17 DIAGNOSIS — R002 Palpitations: Secondary | ICD-10-CM

## 2020-05-17 DIAGNOSIS — G541 Lumbosacral plexus disorders: Secondary | ICD-10-CM | POA: Insufficient documentation

## 2020-05-17 DIAGNOSIS — R37 Sexual dysfunction, unspecified: Secondary | ICD-10-CM | POA: Insufficient documentation

## 2020-05-17 DIAGNOSIS — R0609 Other forms of dyspnea: Secondary | ICD-10-CM | POA: Insufficient documentation

## 2020-05-17 DIAGNOSIS — I10 Essential (primary) hypertension: Secondary | ICD-10-CM | POA: Insufficient documentation

## 2020-05-17 DIAGNOSIS — K219 Gastro-esophageal reflux disease without esophagitis: Secondary | ICD-10-CM

## 2020-05-17 DIAGNOSIS — M79606 Pain in leg, unspecified: Secondary | ICD-10-CM | POA: Insufficient documentation

## 2020-05-17 DIAGNOSIS — J302 Other seasonal allergic rhinitis: Secondary | ICD-10-CM | POA: Insufficient documentation

## 2020-05-17 DIAGNOSIS — R3989 Other symptoms and signs involving the genitourinary system: Secondary | ICD-10-CM | POA: Insufficient documentation

## 2020-05-17 DIAGNOSIS — R0600 Dyspnea, unspecified: Secondary | ICD-10-CM | POA: Insufficient documentation

## 2020-05-17 DIAGNOSIS — E538 Deficiency of other specified B group vitamins: Secondary | ICD-10-CM | POA: Insufficient documentation

## 2020-05-17 DIAGNOSIS — G4733 Obstructive sleep apnea (adult) (pediatric): Secondary | ICD-10-CM | POA: Insufficient documentation

## 2020-05-17 HISTORY — DX: Other forms of dyspnea: R06.09

## 2020-05-17 HISTORY — DX: Infectious mononucleosis, unspecified with other complication: B27.99

## 2020-05-17 HISTORY — DX: Other chest pain: R07.89

## 2020-05-17 NOTE — Progress Notes (Signed)
Cardiology Consultation:    Date:  05/17/2020   ID:  Fedrick, Cefalu [birth year], MRN [id number]  PCP:  Street, Sharon Mt, MD  Cardiologist:  Jenne Campus, MD   Referring MD: Nehemiah Settle, MD   Chief Complaint  Patient presents with  . Palpitations    History of Present Illness:    Justin Cross is a 64 y.o. male who is being seen today for the evaluation of palpitation and chest pain at the request of Nehemiah Settle, MD.  Past medical history significant for essential hypertension, CLL followed by oncology team, history of GERD, who recently was discovered to have some renal lesion and nephrectomy is contemplated.  He was referred to [place] by GI specialist because of multiple issues for several he complained of having some chest pain.  He used to be taking Dexilant for his GERD with very good response however insurance company did not approve the medication he need to change omeprazole.  And recently he started having some chest sensation.  Does not happen with exercise last for a few minutes to few hours, he was taking proton pump inhibitor but with limited success eventually he was able to get Dexilant again and his symptoms disappeared completely he described to have some fatigue tiredness as well as shortness of breath with exertion there is no changes recently.  Also he woke up one time in the morning and with sensation of his heart speeding up he cannot tell me if it is regular or irregular.  He did have a little bit pressure heavy sensation when he got this palpitation.  Gradually slow rate went back to normal.  He did have some shortness of breath with this sensation but no sweating.  He did not feel like he is going to pass out. He walks his 2 dogs on the regular basis with no major difficulties does get some shortness of breath. Smoke long time ago as a teenager for short period time. Does have family history of premature coronary artery disease. His risk  factors include hypertension as well as dyslipidemia both problems are excellently managed by his primary care physician.  Past Medical History:  Diagnosis Date  . B12 deficiency   . Cancer ([place])   . Chest pain   . 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 CLL (chronic lymphoblastic leukemia)   . Constipation   . Diabetes mellitus   . Dyslipidemia   . GERD (gastroesophageal reflux disease)   . High blood pressure   . Hyperlipidemia   . Leg pain   . Lumbosacral plexopathy    12/30/19 - estimates 18-20 years  . Muscle spasm 12/30/2019  . Obesity   . OSA (obstructive sleep apnea)   . Reflux   . Restless leg   . Seasonal allergies    Hay fever  . Sexual dysfunction   . Sleep apnea   . Ulcer   . Urinary problem     Past Surgical History:  Procedure Laterality Date  . [place]?  Marland Kitchen NOSE SURGERY  1977/78   Realignment  . SKIN SURGERY     Basal cell X3    Current Medications: Current Meds  Medication Sig  . Ascorbic Acid (VITAMIN C PO) Take 500 mg by mouth daily. 1 po qd  . b complex vitamins capsule Take 1 capsule by mouth daily. 1 po qd  . B Complex-C (B-COMPLEX WITH VITAMIN C) tablet Take 1 tablet by mouth  daily.  . dexlansoprazole (DEXILANT) 60 MG capsule Take 60 mg by mouth daily.  . Flaxseed, Linseed, (FLAX SEEDS PO) Take 1,000 mg by mouth daily. 1 po qd  . gabapentin (NEURONTIN) 300 MG capsule Take 300 mg by mouth at bedtime. Take 1-3 at bedtime  . Magnesium 400 MG CAPS Take 665 mg by mouth daily. 1 po qd  . metaxalone (SKELAXIN) 800 MG tablet Take 400 mg by mouth as needed for muscle spasms.  . Potassium (POTASSIMIN PO) Take 99 mg by mouth daily. 1 po qd  . simvastatin (ZOCOR) 40 MG tablet Take 40 mg by mouth at bedtime.  Marland Kitchen telmisartan-hydrochlorothiazide (MICARDIS HCT) 40-12.5 MG tablet Take 1 tablet by mouth daily.  . traMADol (ULTRAM) 50 MG tablet Take 50 mg by mouth as needed.  . Zinc 50 MG CAPS Take by mouth daily.      Allergies:   Daypro [oxaprozin]   Social History   Socioeconomic History  . Marital status: Married    Spouse name: Not on file  . Number of children: 2  . Years of education: college  . Highest education level: Associate degree: academic program  Occupational History  . 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 Smoking status: Former Research scientist (life sciences)  . Smokeless tobacco: Never Used  Substance and Sexual Activity  . Alcohol use: No  . Drug use: Never  . Sexual activity: Not on file  Other Topics Concern  . Not on file  Social History Narrative   Lives at home with wife.   Left-handed.   Caffeine: 1/3 of a 2-liter bottle of Diet Mountain Dew per day.   Social Determinants of Health   Financial Resource Strain:   . Difficulty of Paying Living Expenses: Not on file  Food Insecurity:   . Worried About Charity fundraiser in the Last Year: Not on file  . Ran Out of Food in the Last Year: Not on file  Transportation Needs:   . Lack of Transportation (Medical): Not on file  . Lack of Transportation (Non-Medical): Not on file  Physical Activity:   . Days of Exercise per Week: Not on file  . Minutes of Exercise per Session: Not on file  Stress:   . Feeling of Stress : Not on file  Social Connections:   . Frequency of Communication with Friends and Family: Not on file  . Frequency of Social Gatherings with Friends and Family: Not on file  . Attends Religious Services: Not on file  . Active Member of Clubs or Organizations: Not on file  . Attends Archivist Meetings: Not on file  . Marital Status: Not on file     Family History: The patient's family history includes Diabetes in his brother and mother; Heart disease in his father; Hypertension in his mother. There is no history of Coronary artery disease. ROS:   Please see the history of present illness.    All 14 point review of systems negative except as described per  history of present illness.  EKGs/Labs/Other Studies Reviewed:    The following studies were reviewed today:   EKG:  EKG is  ordered today.  The ekg ordered today demonstrates normal sinus rhythm with premature APCs, otherwise normal EKG  Recent Labs: 12/30/2019: ALT 22; BUN 14; Hemoglobin 14.9; Potassium 4.0; Sodium 138; TSH 1.990 04/13/2020: Creatinine, Ser 1.00  Recent Lipid Panel No results found for: CHOL, TRIG, HDL, CHOLHDL, VLDL, LDLCALC,  LDLDIRECT  Physical Exam:    VS:  BP 108/66 (BP Location: Left Arm, Patient Position: Sitting)   Pulse 74   Ht 6\' 2"  (1.88 m)   Wt (!) 324 lb (147 kg)   SpO2 95%   BMI 41.60 kg/m     Wt Readings from Last 3 Encounters:  05/17/20 (!) 324 lb (147 kg)  05/06/20 (!) 325 lb (147.4 kg)  03/01/20 (!) 327 lb 8 oz (148.6 kg)     GEN:  Well nourished, well developed in no acute distress HEENT: Normal NECK: No JVD; No carotid bruits LYMPHATICS: No lymphadenopathy CARDIAC: RRR, no murmurs, no rubs, no gallops RESPIRATORY:  Clear to auscultation without rales, wheezing or rhonchi  ABDOMEN: Soft, non-tender, non-distended MUSCULOSKELETAL:  No edema; No deformity  SKIN: Warm and dry NEUROLOGIC:  Alert and oriented x 3 PSYCHIATRIC:  Normal affect   ASSESSMENT:    1. Palpitations   2. 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 Preop cardiovascular exam   4. Dyspnea on exertion   5. Atypical chest pain   6. Gastroesophageal reflux disease, unspecified whether esophagitis present   7. Cancer [facility])    PLAN:    In order of problems listed above:  1. Palpitations.  I will ask you to wear Zio patch for a week to see if there is any significant arrhythmia.  As a part of evaluation for palpitations I will schedule him to have an echocardiogram to assess left ventricle ejection fraction as well as stress test to rule out ischemia. 2. Diffuse large B-cell lymphoma follow-up by oncology. 3. Preop  evaluation for kidney surgery under general anesthesia.  Will do stress testing to assess his chest pain as well as echocardiogram. 4. Dyspnea on exertion: Probably multifactorial significant weight play a role here, I will do echocardiogram to assess left ventricle ejection fraction.  Also stress test will be done to rule out ischemia. 5. Atypical chest pain now almost completely relieved with proton pump inhibitor.  However because of additional issues stress test will be done. 6. Renal consult is scheduled for surgery.   Medication Adjustments/Labs and Tests Ordered: Current medicines are reviewed at length with the patient today.  Concerns regarding medicines are outlined above.  Orders Placed This Encounter  Procedures  . LONG TERM MONITOR (3-14 DAYS)  . MYOCARDIAL PERFUSION IMAGING  . EKG 12-Lead  . ECHOCARDIOGRAM COMPLETE   No orders of the defined types were placed in this encounter.   Signed, Park Liter, MD, [facility]. 05/17/2020 10:18 AM    [place]

## 2020-05-17 NOTE — Patient Instructions (Signed)
Medication Instructions:  Your physician recommends that you continue on your current medications as directed. Please refer to the Current Medication list given to you today.   *If you need a refill on your cardiac medications before your next appointment, please call your pharmacy*   Lab Work: None If you have labs (blood work) drawn today and your tests are completely normal, you will receive your results only by: Marland Kitchen MyChart Message (if you have MyChart) OR . A paper copy in the mail If you have any lab test that is abnormal or we need to change your treatment, we will call you to review the results.   Testing/Procedures: A zio monitor was ordered today. It will remain on for 7 days. You will then return monitor and event diary in provided box. It takes 1-2 weeks for report to be downloaded and returned to [place]. We will call you with the results. If monitor falls off or has orange flashing light, please call Zio for further instructions.    Your physician has requested that you have an echocardiogram. Echocardiography is a painless test that uses sound waves to create images of your heart. It provides your doctor with information about the size and shape of your heart and how well your heart's chambers and valves are working. This procedure takes approximately one hour. There are no restrictions for this procedure.    [facility] [address] [place], [place] [address] Phone:  [phone]    Please arrive 15 minutes prior to your appointment time for registration and insurance purposes.  The test will take approximately 3 to 4 hours to complete; you may bring reading material.  If someone comes with you to your appointment, they will need to remain in the main lobby due to limited space in the testing area. **If you are pregnant or breastfeeding, please notify the nuclear lab prior to your appointment**  How to prepare for your Myocardial Perfusion  Test: . Do not eat or drink 3 hours prior to your test, except you may have water. . Do not consume products containing caffeine (regular or decaffeinated) 12 hours prior to your test. (ex: coffee, chocolate, sodas, tea). . Do bring a list of your current medications with you.  If not listed below, you may take your medications as normal. .  . Do wear comfortable clothes (no dresses or overalls) and walking shoes, tennis shoes preferred (No heels or open toe shoes are allowed). . Do NOT wear cologne, perfume, aftershave, or lotions (deodorant is allowed). . If these instructions are not followed, your test will have to be rescheduled.  Please report to [address] for your test.  If you have questions or concerns about your appointment, you can call the [facility] at [phone].  If you cannot keep your appointment, please provide 24 hours notification to the Nuclear Lab, to avoid a possible $50 charge to your account.   Follow-Up: At [facility], you and your health needs are our priority.  As part of our continuing mission to provide you with exceptional heart care, we have created designated Provider Care Teams.  These Care Teams include your primary Cardiologist (physician) and Advanced Practice Providers (APPs -  Physician Assistants and Nurse Practitioners) who all work together to provide you with the care you need, when you need it.  We recommend signing up for the patient portal called "MyChart".  Sign up information is provided on this  After Visit Summary.  MyChart is used to connect with patients for Virtual Visits (Telemedicine).  Patients are able to view lab/test results, encounter notes, upcoming appointments, etc.  Non-urgent messages can be sent to your provider as well.   To learn more about what you can do with MyChart, go to [email or web address].    Your next appointment:   2 month(s)  The format for your next appointment:    In Person  Provider:   Jenne Campus, MD   Other Instructions   Echocardiogram An echocardiogram is a procedure that uses painless sound waves (ultrasound) to produce an image of the heart. Images from an echocardiogram can provide important information about:  Signs of coronary artery disease (CAD).  Aneurysm detection. An aneurysm is a weak or damaged part of an artery wall that bulges out from the normal force of blood pumping through the body.  Heart size and shape. Changes in the size or shape of the heart can be associated with certain conditions, including heart failure, aneurysm, and CAD.  Heart muscle function.  Heart valve function.  Signs of a past heart attack.  Fluid buildup around the heart.  Thickening of the heart muscle.  A tumor or infectious growth around the heart valves. Tell a health care provider about:  Any allergies you have.  All medicines you are taking, including vitamins, herbs, eye drops, creams, and over-the-counter medicines.  Any blood disorders you have.  Any surgeries you have had.  Any medical conditions you have.  Whether you are pregnant or may be pregnant. What are the risks? Generally, this is a safe procedure. However, problems may occur, including:  Allergic reaction to dye (contrast) that may be used during the procedure. What happens before the procedure? No specific preparation is needed. You may eat and drink normally. What happens during the procedure?   An IV tube may be inserted into one of your veins.  You may receive contrast through this tube. A contrast is an injection that improves the quality of the pictures from your heart.  A gel will be applied to your chest.  A wand-like tool (transducer) will be moved over your chest. The gel will help to transmit the sound waves from the transducer.  The sound waves will harmlessly bounce off of your heart to allow the heart images to be captured in real-time  motion. The images will be recorded on a computer. The procedure may vary among health care providers and hospitals. What happens after the procedure?  You may return to your normal, everyday life, including diet, activities, and medicines, unless your health care provider tells you not to do that. Summary  An echocardiogram is a procedure that uses painless sound waves (ultrasound) to produce an image of the heart.  Images from an echocardiogram can provide important information about the size and shape of your heart, heart muscle function, heart valve function, and fluid buildup around your heart.  You do not need to do anything to prepare before this procedure. You may eat and drink normally.  After the echocardiogram is completed, you may return to your normal, everyday life, unless your health care provider tells you not to do that. This information is not intended to replace advice given to you by your health care provider. Make sure you discuss any questions you have with your health care provider. Document Revised: 10/10/2018 Document Reviewed: 07/22/2016 Elsevier Patient Education  [place].   Cardiac Nuclear Scan A cardiac nuclear scan  is a test that measures blood flow to the heart when a person is resting and when he or she is exercising. The test looks for problems such as:  Not enough blood reaching a portion of the heart.  The heart muscle not working normally. You may need this test if:  You have heart disease.  You have had abnormal lab results.  You have had heart surgery or a balloon procedure to open up blocked arteries (angioplasty).  You have chest pain.  You have shortness of breath. In this test, a radioactive dye (tracer) is injected into your bloodstream. After the tracer has traveled to your heart, an imaging device is used to measure how much of the tracer is absorbed by or distributed to various areas of your heart. This procedure is usually  done at a hospital and takes 2-4 hours. Tell a health care provider about:  Any allergies you have.  All medicines you are taking, including vitamins, herbs, eye drops, creams, and over-the-counter medicines.  Any problems you or family members have had with anesthetic medicines.  Any blood disorders you have.  Any surgeries you have had.  Any medical conditions you have.  Whether you are pregnant or may be pregnant. What are the risks? Generally, this is a safe procedure. However, problems may occur, including:  Serious chest pain and heart attack. This is only a risk if the stress portion of the test is done.  Rapid heartbeat.  Sensation of warmth in your chest. This usually passes quickly.  Allergic reaction to the tracer. What happens before the procedure?  Ask your health care provider about changing or stopping your regular medicines. This is especially important if you are taking diabetes medicines or blood thinners.  Follow instructions from your health care provider about eating or drinking restrictions.  Remove your jewelry on the day of the procedure. What happens during the procedure?  An IV will be inserted into one of your veins.  Your health care provider will inject a small amount of radioactive tracer through the IV.  You will wait for 20-40 minutes while the tracer travels through your bloodstream.  Your heart activity will be monitored with an electrocardiogram (ECG).  You will lie down on an exam table.  Images of your heart will be taken for about 15-20 minutes.  You may also have a stress test. For this test, one of the following may be done: ? You will exercise on a treadmill or stationary bike. While you exercise, your heart's activity will be monitored with an ECG, and your blood pressure will be checked. ? You will be given medicines that will increase blood flow to parts of your heart. This is done if you are unable to exercise.  When  blood flow to your heart has peaked, a tracer will again be injected through the IV.  After 20-40 minutes, you will get back on the exam table and have more images taken of your heart.  Depending on the type of tracer used, scans may need to be repeated 3-4 hours later.  Your IV line will be removed when the procedure is over. The procedure may vary among health care providers and hospitals. What happens after the procedure?  Unless your health care provider tells you otherwise, you may return to your normal schedule, including diet, activities, and medicines.  Unless your health care provider tells you otherwise, you may increase your fluid intake. This will help to flush the contrast dye from  your body. Drink enough fluid to keep your urine pale yellow.  Ask your health care provider, or the department that is doing the test: ? When will my results be ready? ? How will I get my results? Summary  A cardiac nuclear scan measures the blood flow to the heart when a person is resting and when he or she is exercising.  Tell your health care provider if you are pregnant.  Before the procedure, ask your health care provider about changing or stopping your regular medicines. This is especially important if you are taking diabetes medicines or blood thinners.  After the procedure, unless your health care provider tells you otherwise, increase your fluid intake. This will help flush the contrast dye from your body.  After the procedure, unless your health care provider tells you otherwise, you may return to your normal schedule, including diet, activities, and medicines. This information is not intended to replace advice given to you by your health care provider. Make sure you discuss any questions you have with your health care provider. Document Revised: 12/03/2017 Document Reviewed: 12/03/2017 Elsevier Patient Education  [place].

## 2020-05-24 ENCOUNTER — Encounter: Payer: Self-pay | Admitting: Cardiology

## 2020-05-24 DIAGNOSIS — R0600 Dyspnea, unspecified: Secondary | ICD-10-CM

## 2020-05-25 ENCOUNTER — Telehealth (HOSPITAL_COMMUNITY): Payer: Self-pay | Admitting: *Deleted

## 2020-05-25 NOTE — Telephone Encounter (Signed)
Left message on voicemail per DPR in reference to upcoming appointment scheduled on 05/31/20 at 1:00 with detailed instructions given per Myocardial Perfusion Study Information Sheet for the test. LM to arrive 15 minutes early, and that it is imperative to arrive on time for appointment to keep from having the test rescheduled. If you need to cancel or reschedule your appointment, please call the office within 24 hours of your appointment. Failure to do so may result in a cancellation of your appointment, and a $50 no show fee. Phone number given for call back for any questions.

## 2020-05-31 ENCOUNTER — Ambulatory Visit (HOSPITAL_COMMUNITY): Payer: Commercial Managed Care - PPO | Attending: Cardiovascular Disease

## 2020-05-31 ENCOUNTER — Other Ambulatory Visit: Payer: Self-pay

## 2020-05-31 DIAGNOSIS — Z01810 Encounter for preprocedural cardiovascular examination: Secondary | ICD-10-CM | POA: Insufficient documentation

## 2020-05-31 DIAGNOSIS — R002 Palpitations: Secondary | ICD-10-CM | POA: Diagnosis not present

## 2020-05-31 MED ORDER — TECHNETIUM TC 99M TETROFOSMIN IV KIT
30.8000 | PACK | Freq: Once | INTRAVENOUS | Status: AC | PRN
  Administered 2020-05-31: 30.8 via INTRAVENOUS
  Filled 2020-05-31: qty 31

## 2020-05-31 MED ORDER — REGADENOSON 0.4 MG/5ML IV SOLN
0.4000 mg | Freq: Once | INTRAVENOUS | Status: AC
  Administered 2020-05-31: 0.4 mg via INTRAVENOUS

## 2020-05-31 NOTE — Patient Instructions (Addendum)
DUE TO COVID-19 ONLY ONE VISITOR IS ALLOWED TO COME WITH YOU AND STAY IN THE WAITING ROOM ONLY DURING PRE OP AND PROCEDURE DAY OF SURGERY. THE 1 VISITOR  MAY VISIT WITH YOU AFTER SURGERY IN YOUR PRIVATE ROOM DURING VISITING HOURS ONLY!  YOU NEED TO HAVE A COVID 19 TEST ON__12/4_____ @_10 :15______, THIS TEST MUST BE DONE BEFORE SURGERY,  COVID TESTING SITE [place] [address], IT IS ON THE RIGHT GOING OUT [address] APPROXIMATELY  2 MINUTES PAST [employer] ON THE RIGHT . ONCE YOUR COVID TEST IS COMPLETED,  PLEASE BEGIN THE QUARANTINE INSTRUCTIONS AS OUTLINED IN YOUR HANDOUT.                Justin Cross    Your procedure is scheduled on: 06/09/20   Report to [facility] Main  Entrance   Report to admitting at   6:30 AM     Call this number if you have problems the morning of surgery [phone]    Remember: Do not eat food or drink liquids :After Midnight.   BRUSH YOUR TEETH MORNING OF SURGERY AND RINSE YOUR MOUTH OUT, NO CHEWING GUM CANDY OR MINTS.     Take these medicines the morning of surgery with A SIP OF WATER: none. Bring your mask and tubing to the hospital  DO NOT [place] may not have any metal on your body including               piercings  Do not wear jewelry,  lotions, powders or deodorant              Men may shave face and neck.   Do not bring valuables to the hospital. [place].  Contacts, dentures or bridgework may not be worn into surgery.       _____________________________________________________________________             [facility] - Preparing for Surgery  Before surgery, you can play an important role.   Because skin is not sterile, your skin needs to be as free of germs as possible.   You can reduce the number of germs on your skin by washing with CHG (chlorahexidine  gluconate) soap before surgery.   CHG is an antiseptic cleaner which kills germs and bonds with the skin to continue killing germs even after washing. Please DO NOT use if you have an allergy to CHG or antibacterial soaps .  If your skin becomes reddened/irritated stop using the CHG and inform your nurse when you arrive at Short Stay. .  You may shave your face/neck.  Please follow these instructions carefully:  1.  Shower with CHG Soap the night before surgery and the  morning of Surgery.  2.  If you choose to wash your hair, wash your hair first as usual with your  normal  shampoo.  3.  After you shampoo, rinse your hair and body thoroughly to remove the  shampoo.  4.  Use CHG as you would any other liquid soap.  You can apply chg directly  to the skin and wash                       Gently with a scrungie or clean washcloth.  5.  Apply the CHG Soap to your body ONLY FROM THE NECK DOWN.   Do not use on face/ open                           Wound or open sores. Avoid contact with eyes, ears mouth and genitals (private parts).                       Wash face,  Genitals (private parts) with your normal soap.             6.  Wash thoroughly, paying special attention to the area where your surgery  will be performed.  7.  Thoroughly rinse your body with warm water from the neck down.  8.  DO NOT shower/wash with your normal soap after using and rinsing off  the CHG Soap.             9.  Pat yourself dry with a clean towel.            10.  Wear clean pajamas.            11.  Place clean sheets on your bed the night of your first shower and do not  sleep with pets. Day of Surgery : Do not apply any lotions/deodorants the morning of surgery.  Please wear clean clothes to the hospital/surgery center.  FAILURE TO FOLLOW THESE INSTRUCTIONS MAY RESULT IN THE CANCELLATION OF YOUR SURGERY PATIENT SIGNATURE_________________________________  NURSE  SIGNATURE__________________________________  ________________________________________________________________________

## 2020-06-01 ENCOUNTER — Ambulatory Visit (HOSPITAL_COMMUNITY): Payer: Commercial Managed Care - PPO | Attending: Cardiovascular Disease

## 2020-06-01 ENCOUNTER — Telehealth: Payer: Self-pay | Admitting: Cardiology

## 2020-06-01 LAB — MYOCARDIAL PERFUSION IMAGING
LV dias vol: 132 mL (ref 62–150)
LV sys vol: 68 mL
Peak HR: 93 {beats}/min
Rest HR: 71 {beats}/min
SDS: 0
SRS: 0
SSS: 0
TID: 1.07

## 2020-06-01 MED ORDER — TECHNETIUM TC 99M TETROFOSMIN IV KIT
27.1000 | PACK | Freq: Once | INTRAVENOUS | Status: AC | PRN
  Administered 2020-06-01: 27.1 via INTRAVENOUS
  Filled 2020-06-01: qty 28

## 2020-06-01 NOTE — Telephone Encounter (Signed)
Justin Cross is calling requesting his most recent OV notes and test results be sent to Dr. Tresa Moore with [facility] before his upcoming procedure on 06/09/20. Please advise.

## 2020-06-02 ENCOUNTER — Encounter (HOSPITAL_COMMUNITY)
Admission: RE | Admit: 2020-06-02 | Discharge: 2020-06-02 | Disposition: A | Discharge status: Home / Self Care | Payer: Commercial Managed Care - PPO | Source: Ambulatory Visit | Attending: Urology | Admitting: Urology

## 2020-06-02 ENCOUNTER — Encounter (HOSPITAL_COMMUNITY): Payer: Self-pay

## 2020-06-02 ENCOUNTER — Other Ambulatory Visit: Payer: Self-pay

## 2020-06-02 DIAGNOSIS — N2889 Other specified disorders of kidney and ureter: Secondary | ICD-10-CM | POA: Insufficient documentation

## 2020-06-02 DIAGNOSIS — G4733 Obstructive sleep apnea (adult) (pediatric): Secondary | ICD-10-CM | POA: Diagnosis not present

## 2020-06-02 DIAGNOSIS — Z79899 Other long term (current) drug therapy: Secondary | ICD-10-CM | POA: Diagnosis not present

## 2020-06-02 DIAGNOSIS — Z01812 Encounter for preprocedural laboratory examination: Secondary | ICD-10-CM | POA: Diagnosis present

## 2020-06-02 DIAGNOSIS — Z87891 Personal history of nicotine dependence: Secondary | ICD-10-CM | POA: Diagnosis not present

## 2020-06-02 DIAGNOSIS — I5030 Unspecified diastolic (congestive) heart failure: Secondary | ICD-10-CM | POA: Diagnosis not present

## 2020-06-02 DIAGNOSIS — K219 Gastro-esophageal reflux disease without esophagitis: Secondary | ICD-10-CM | POA: Insufficient documentation

## 2020-06-02 HISTORY — DX: Prediabetes: R73.03

## 2020-06-02 HISTORY — DX: Angina pectoris, unspecified: I20.9

## 2020-06-02 HISTORY — DX: Cardiac arrhythmia, unspecified: I49.9

## 2020-06-02 LAB — CBC
HCT: 46.6 % (ref 39.0–52.0)
Hemoglobin: 15.8 g/dL (ref 13.0–17.0)
MCH: 30.3 pg (ref 26.0–34.0)
MCHC: 33.9 g/dL (ref 30.0–36.0)
MCV: 89.4 fL (ref 80.0–100.0)
Platelets: 159 10*3/uL (ref 150–400)
RBC: 5.21 MIL/uL (ref 4.22–5.81)
RDW: 13 % (ref 11.5–15.5)
WBC: 22.5 10*3/uL — ABNORMAL HIGH (ref 4.0–10.5)
nRBC: 0 % (ref 0.0–0.2)

## 2020-06-02 LAB — BASIC METABOLIC PANEL
Anion gap: 8 (ref 5–15)
BUN: 11 mg/dL (ref 8–23)
CO2: 24 mmol/L (ref 22–32)
Calcium: 9 mg/dL (ref 8.9–10.3)
Chloride: 105 mmol/L (ref 98–111)
Creatinine, Ser: 0.91 mg/dL (ref 0.61–1.24)
GFR, Estimated: >60 mL/min (ref >60)
Glucose, Bld: 149 mg/dL — ABNORMAL HIGH (ref 70–99)
Potassium: 4.3 mmol/L (ref 3.5–5.1)
Sodium: 137 mmol/L (ref 135–145)

## 2020-06-02 LAB — HEMOGLOBIN A1C
Hgb A1c MFr Bld: 6.3 % — ABNORMAL HIGH (ref 4.8–5.6)
Mean Plasma Glucose: 134.11 mg/dL

## 2020-06-02 NOTE — Progress Notes (Signed)
COVID Vaccine Completed:Yes Date COVID Vaccine completed:10/15/19- Booster 05/12/20 COVID vaccine manufacturer:    Moderna      PCP - Dr. Loletha Grayer Street Cardiologist - Dr. Nelta Numbers  Chest x-ray - no EKG - 05/17/20-Epic Stress Test - 06/01/20- Epic ECHO - 05/17/20-Epic Cardiac Cath - no Pacemaker/ICD device last checked:NA  Sleep Study - yes CPAP - no he uses a wedge at night because he couldn't tolerate the mask and severe had GERD. that caused chest pressure and fast heart rate  Fasting Blood Sugar - NA Checks Blood Sugar _____ times a day  Blood Thinner Instructions:NA Aspirin Instructions: Last Dose:  Anesthesia review:   Patient denies shortness of breath, fever, cough and chest pain at PAT appointment yes   Patient verbalized understanding of instructions that were given to them at the PAT appointment. Patient was also instructed that they will need to review over the PAT instructions again at home before surgery.Yes Pt has no SOB with activities. He just finished a cardiac work up for this surgery.

## 2020-06-03 ENCOUNTER — Telehealth: Payer: Self-pay

## 2020-06-03 NOTE — Telephone Encounter (Signed)
° °  Bethena Roys with [facility] called, she said she received pt's records but they only got pages 1-11. Pages 12-18 is missing

## 2020-06-03 NOTE — Telephone Encounter (Signed)
Resent records. 

## 2020-06-03 NOTE — Telephone Encounter (Signed)
    Celetha from [facility] calling back, she said they received medical records and OV notes but they need official cardiac clearance sent to them to give to their anesthesiologist

## 2020-06-03 NOTE — Telephone Encounter (Signed)
Left message for [facility] to return call.

## 2020-06-03 NOTE — Telephone Encounter (Signed)
Requested records faxed to [facility].

## 2020-06-03 NOTE — Telephone Encounter (Signed)
   [facility] Pre-operative Risk Assessment    Request for surgical clearance:  1. What type of surgery is being performed? Left Robotic Assited Radical    2. When is this surgery scheduled? 06/09/2020   3. What type of clearance is required (medical clearance vs. Pharmacy clearance to hold med vs. Both)? Medical Clearance   4. Are there any medications that need to be held prior to surgery and how long?None   5. Practice name and name of physician performing surgery? Dr. Alexis Frock   6. What is your office phone number: [phone]    7.   What is your office fax number: [phone]  8.   Anesthesia type (None, local, MAC, general) ? None specified   Justin Cross 06/03/2020, 5:00 PM  _________________________________________________________________   (provider comments below)

## 2020-06-04 NOTE — Telephone Encounter (Signed)
Justin Cross. 8 64 year old male is requesting robotic assisted left nephrectomy.  He was last seen by you in clinic on 05/17/2020.  He was seen for evaluation of his palpitations and chest pain.  His PMH includes essential hypertension, CLL, GERD, and renal lesion.  Due to some chest sensations he was referred for further evaluation.  His sensations did not happen with exercise, lasted for a few minutes-hours.  He was receiving limited relief from his PPI.  He also reported waking up with sensation of fast heart beat but could not tell if it was irregularly irregular.  He described pressure/heavy sensation with palpitations.   He had stress test on 06/01/2020 which showed low risk and no ischemia EF was 48%.  You also recommended echocardiogram which has not yet been completed.  Does he need echocardiogram prior to procedure?  Thank you for your help.  Please direct your response to [facility] preop pool.  Justin Cross. Justin Kirsh NP-C    06/04/2020, 11:16 AM Justin Cross [address] Office [phone] Fax [phone]

## 2020-06-04 NOTE — Telephone Encounter (Signed)
It looks like his surgery was on 8 December, echocardiogram is not absolutely necessary before the surgery but would be nice to have it.  I will schedule him to have it done in the hospital on Monday.

## 2020-06-04 NOTE — Progress Notes (Addendum)
Anesthesia Chart Review   Case: [id number] Date/Time: 06/09/20 0815   Procedure: XI ROBOTIC ASSISTED LAPAROSCOPIC RADICAL NEPHRECTOMY (Left ) - 3 HRS   Anesthesia type: General   Pre-op diagnosis: LEFT RENAL MASS   Location: [facility] ROOM 03 / [facility]   Surgeons: Alexis Frock, MD      DISCUSSION:64 y.o. former smoker with h/o GERD, OSA, CLL, left renal mass scheduled for above procedure 06/09/20 with Dr. Alexis Frock.   Pt seen by cardiology 05/17/2020 for evaluation of palpitations and chest pain.  At this time stress test and echo ordered.  Stress test 06/01/20, low risk study.  Echo has not been completed at this time.    Per Dr. Agustin Cree, "It looks like his surgery was on 8 December, echocardiogram is not absolutely necessary before the surgery but would be nice to have it.  I will schedule him to have it done in the hospital on Monday."  Will f/u on Echo.  Addendum 06/07/2020:  Per Dr. Drue Dun, "His echocardiogram done in the hospital showed preserved left ventricle ejection fraction, good to proceed with surgery"  Anticipate pt can proceed with planned procedure barring acute status change.   VS: BP (!) 152/82   Pulse 67   Temp 36.6 C (Oral)   Resp 18   Ht 6\' 2"  (1.88 m)   Wt (!) 145.6 kg   SpO2 97%   BMI 41.21 kg/m   PROVIDERS: Street, Sharon Mt, MD is PCP   Jenne Campus, MD is Cardiologist  LABS: Labs reviewed: Acceptable for surgery. (all labs ordered are listed, but only abnormal results are displayed)  Labs Reviewed  BASIC METABOLIC PANEL - Abnormal; Notable for the following components:      Result Value   Glucose, Bld 149 (*)    All other components within normal limits  CBC - Abnormal; Notable for the following components:   WBC 22.5 (*)    All other components within normal limits  HEMOGLOBIN A1C - Abnormal; Notable for the following components:   Hgb A1c MFr Bld 6.3 (*)    All other components within normal limits      IMAGES:   EKG: 05/17/2020 Rate 74 bpm  Sinus rhythm with PVCs  CV: Stress Test 06/01/2020  Nuclear stress EF: 48%.  The left ventricular ejection fraction is mildly decreased (45-54%).  The study is normal.  This is a low risk study.  No ischemia.  Past Medical History:  Diagnosis Date  . Anginal pain (HCC)    reflux  . B12 deficiency   . Cancer ([place])   . Chest pain   . 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 CLL (chronic lymphoblastic leukemia)   . Constipation   . Dyslipidemia   . Dysrhythmia    "racing heart"  . GERD (gastroesophageal reflux disease)   . High blood pressure   . Hyperlipidemia   . Leg pain   . Lumbosacral plexopathy    12/30/19 - estimates 18-20 years  . Muscle spasm 12/30/2019  . Obesity   . OSA (obstructive sleep apnea)   . Pre-diabetes   . Reflux   . Restless leg   . Seasonal allergies    Hay fever  . Sexual dysfunction   . Sleep apnea   . Ulcer   . Urinary problem     Past Surgical History:  Procedure Laterality Date  . [place]?  Marland Kitchen NOSE SURGERY  1977/78   Realignment  . SKIN  SURGERY     Basal cell X3    MEDICATIONS: . acetaminophen (TYLENOL) 500 MG tablet  . Ascorbic Acid (VITAMIN C PO)  . b complex vitamins capsule  . dexlansoprazole (DEXILANT) 60 MG capsule  . Flaxseed, Linseed, (FLAX SEEDS PO)  . gabapentin (NEURONTIN) 300 MG capsule  . MAGNESIUM PO  . metaxalone (SKELAXIN) 800 MG tablet  . Potassium (POTASSIMIN PO)  . Potassium 99 MG TABS  . simvastatin (ZOCOR) 40 MG tablet  . telmisartan-hydrochlorothiazide (MICARDIS HCT) 40-12.5 MG tablet  . traMADol (ULTRAM) 50 MG tablet  . Zinc 50 MG CAPS   No current facility-administered medications for this encounter.   Konrad Felix, PA-C [facility] Pre-Surgical Testing [phone]

## 2020-06-05 ENCOUNTER — Other Ambulatory Visit (HOSPITAL_COMMUNITY)
Admission: RE | Admit: 2020-06-05 | Discharge: 2020-06-05 | Disposition: A | Discharge status: Home / Self Care | Payer: Commercial Managed Care - PPO | Source: Ambulatory Visit | Attending: Urology | Admitting: Urology

## 2020-06-05 DIAGNOSIS — Z20822 Contact with and (suspected) exposure to covid-19: Secondary | ICD-10-CM | POA: Diagnosis not present

## 2020-06-05 DIAGNOSIS — Z01812 Encounter for preprocedural laboratory examination: Secondary | ICD-10-CM | POA: Diagnosis not present

## 2020-06-06 LAB — SARS CORONAVIRUS 2 (TAT 6-24 HRS): SARS Coronavirus 2: NEGATIVE

## 2020-06-07 NOTE — Anesthesia Preprocedure Evaluation (Addendum)
Anesthesia Evaluation  Patient identified by MRN, date of birth, ID band Patient awake    Reviewed: Allergy & Precautions, NPO status , Patient's Chart, lab work & pertinent test results  Airway Mallampati: III  TM Distance: >3 FB Neck ROM: Full    Dental no notable dental hx.    Pulmonary sleep apnea , former smoker,    Pulmonary exam normal breath sounds clear to auscultation       Cardiovascular hypertension, Pt. on medications Normal cardiovascular exam Rhythm:Regular Rate:Normal  Myocardial perfusion: Nuclear stress EF: 48%. The left ventricular ejection fraction is mildly decreased (45-54%). The study is normal. This is a low risk study. No ischemia.    Neuro/Psych  Neuromuscular disease negative psych ROS   GI/Hepatic Neg liver ROS, GERD  Medicated and Controlled,  Endo/Other  Morbid obesityCLL (chronic lymphoblastic leukemia)  Renal/GU negative Renal ROS     Musculoskeletal negative musculoskeletal ROS (+)   Abdominal (+) + obese,   Peds  Hematology HLD   Anesthesia Other Findings LEFT RENAL MASS  Reproductive/Obstetrics                           Anesthesia Physical Anesthesia Plan  ASA: III  Anesthesia Plan: General   Post-op Pain Management:    Induction: Intravenous  PONV Risk Score and Plan: 3 and Ondansetron, Dexamethasone, Midazolam and Treatment may vary due to age or medical condition  Airway Management Planned: Oral ETT  Additional Equipment:   Intra-op Plan:   Post-operative Plan: Extubation in OR  Informed Consent: I have reviewed the patients History and Physical, chart, labs and discussed the procedure including the risks, benefits and alternatives for the proposed anesthesia with the patient or authorized representative who has indicated his/her understanding and acceptance.     Dental advisory given  Plan Discussed with: CRNA  Anesthesia Plan  Comments: (Reviewed PAT note 06/02/2020, Konrad Felix, PA-C Potential arterial line placement discussed)      Anesthesia Quick Evaluation

## 2020-06-07 NOTE — Telephone Encounter (Signed)
His echocardiogram done in the hospital showed preserved left ventricle ejection fraction, good to proceed with surgery

## 2020-06-07 NOTE — Telephone Encounter (Signed)
   Primary Cardiologist: Dr. Agustin Cree  Chart reviewed as part of pre-operative protocol coverage. Patient was recently seen by Dr. Agustin Cree on 11/15/2021at which time he noted atypical chest pain, dyspnea on exertion, and palpitations. He also mentioned need for upcoming surgery. Myoview and Echo were ordered for further evaluation. Myoview showed no evidence of ischemia. Echo preserved LVEF. Therefore, per Dr Agustin Cree, patient OK to proceed with surgery.   I will route this recommendation to the requesting party via Epic fax function and remove from pre-op pool.  Please call with questions.  Darreld Mclean, PA-C 06/07/2020, 1:25 PM

## 2020-06-08 MED ORDER — DEXTROSE 5 % IV SOLN
3.0000 g | INTRAVENOUS | Status: AC
  Administered 2020-06-09: 3 g via INTRAVENOUS
  Filled 2020-06-08: qty 3000
  Filled 2020-06-08: qty 3

## 2020-06-09 ENCOUNTER — Encounter (HOSPITAL_COMMUNITY): Payer: Self-pay | Admitting: Urology

## 2020-06-09 ENCOUNTER — Ambulatory Visit (HOSPITAL_COMMUNITY): Payer: Commercial Managed Care - PPO | Admitting: Certified Registered"

## 2020-06-09 ENCOUNTER — Ambulatory Visit (HOSPITAL_COMMUNITY): Payer: Commercial Managed Care - PPO | Admitting: Physician Assistant

## 2020-06-09 ENCOUNTER — Encounter (HOSPITAL_COMMUNITY)
Admission: RE | Disposition: A | Discharge status: Home / Self Care | Payer: Self-pay | Source: Home / Self Care | Attending: Urology

## 2020-06-09 ENCOUNTER — Other Ambulatory Visit: Payer: Self-pay

## 2020-06-09 ENCOUNTER — Inpatient Hospital Stay (HOSPITAL_COMMUNITY)
Admission: RE | Admit: 2020-06-09 | Discharge: 2020-06-10 | DRG: 657 | Disposition: A | Discharge status: Home / Self Care | Payer: Commercial Managed Care - PPO | Attending: Urology | Admitting: Urology

## 2020-06-09 DIAGNOSIS — R7303 Prediabetes: Secondary | ICD-10-CM | POA: Diagnosis present

## 2020-06-09 DIAGNOSIS — G4733 Obstructive sleep apnea (adult) (pediatric): Secondary | ICD-10-CM | POA: Diagnosis present

## 2020-06-09 DIAGNOSIS — Z79899 Other long term (current) drug therapy: Secondary | ICD-10-CM

## 2020-06-09 DIAGNOSIS — C9110 Chronic lymphocytic leukemia of B-cell type not having achieved remission: Secondary | ICD-10-CM | POA: Diagnosis present

## 2020-06-09 DIAGNOSIS — Z833 Family history of diabetes mellitus: Secondary | ICD-10-CM

## 2020-06-09 DIAGNOSIS — I10 Essential (primary) hypertension: Secondary | ICD-10-CM | POA: Diagnosis present

## 2020-06-09 DIAGNOSIS — G2581 Restless legs syndrome: Secondary | ICD-10-CM | POA: Diagnosis present

## 2020-06-09 DIAGNOSIS — C642 Malignant neoplasm of left kidney, except renal pelvis: Secondary | ICD-10-CM | POA: Diagnosis present

## 2020-06-09 DIAGNOSIS — J302 Other seasonal allergic rhinitis: Secondary | ICD-10-CM | POA: Diagnosis present

## 2020-06-09 DIAGNOSIS — K219 Gastro-esophageal reflux disease without esophagitis: Secondary | ICD-10-CM | POA: Diagnosis present

## 2020-06-09 DIAGNOSIS — N2889 Other specified disorders of kidney and ureter: Secondary | ICD-10-CM | POA: Diagnosis present

## 2020-06-09 DIAGNOSIS — N281 Cyst of kidney, acquired: Secondary | ICD-10-CM | POA: Diagnosis present

## 2020-06-09 DIAGNOSIS — E785 Hyperlipidemia, unspecified: Secondary | ICD-10-CM | POA: Diagnosis present

## 2020-06-09 DIAGNOSIS — Z888 Allergy status to other drugs, medicaments and biological substances status: Secondary | ICD-10-CM

## 2020-06-09 DIAGNOSIS — Z6841 Body Mass Index (BMI) 40.0 and over, adult: Secondary | ICD-10-CM

## 2020-06-09 HISTORY — PX: ROBOT ASSISTED LAPAROSCOPIC NEPHRECTOMY: SHX5140

## 2020-06-09 HISTORY — DX: Other specified disorders of kidney and ureter: N28.89

## 2020-06-09 LAB — TYPE AND SCREEN
ABO/RH(D): O POS
Antibody Screen: NEGATIVE

## 2020-06-09 LAB — HEMOGLOBIN AND HEMATOCRIT, BLOOD
HCT: 44.3 % (ref 39.0–52.0)
Hemoglobin: 14.8 g/dL (ref 13.0–17.0)

## 2020-06-09 LAB — ABO/RH: ABO/RH(D): O POS

## 2020-06-09 SURGERY — NEPHRECTOMY, RADICAL, ROBOT-ASSISTED, LAPAROSCOPIC, ADULT
Anesthesia: General | Site: Abdomen | Laterality: Left

## 2020-06-09 MED ORDER — GABAPENTIN 300 MG PO CAPS
600.0000 mg | ORAL_CAPSULE | Freq: Every day | ORAL | Status: DC
  Administered 2020-06-09: 600 mg via ORAL
  Filled 2020-06-09: qty 2

## 2020-06-09 MED ORDER — CHLORHEXIDINE GLUCONATE 0.12 % MT SOLN: 15.0000 mL | Freq: Once | OROMUCOSAL | Status: DC

## 2020-06-09 MED ORDER — ROCURONIUM BROMIDE 10 MG/ML (PF) SYRINGE
PREFILLED_SYRINGE | INTRAVENOUS | Status: AC
  Filled 2020-06-09: qty 10

## 2020-06-09 MED ORDER — CHLORHEXIDINE GLUCONATE CLOTH 2 % EX PADS: 6.0000 | MEDICATED_PAD | Freq: Every day | CUTANEOUS | Status: DC

## 2020-06-09 MED ORDER — SODIUM CHLORIDE (PF) 0.9 % IJ SOLN
INTRAMUSCULAR | Status: AC
  Filled 2020-06-09: qty 20

## 2020-06-09 MED ORDER — OXYCODONE HCL 5 MG PO TABS: 5.0000 mg | ORAL_TABLET | Freq: Once | ORAL | Status: DC | PRN

## 2020-06-09 MED ORDER — ORAL CARE MOUTH RINSE: 15.0000 mL | Freq: Once | OROMUCOSAL | Status: DC

## 2020-06-09 MED ORDER — HYDROMORPHONE HCL 1 MG/ML IJ SOLN
0.5000 mg | INTRAMUSCULAR | Status: DC | PRN
  Administered 2020-06-09: 1 mg via INTRAVENOUS
  Filled 2020-06-09 (×2): qty 1

## 2020-06-09 MED ORDER — LACTATED RINGERS IV SOLN: INTRAVENOUS | Status: DC | PRN

## 2020-06-09 MED ORDER — OXYCODONE HCL 5 MG PO TABS
5.0000 mg | ORAL_TABLET | ORAL | Status: DC | PRN
  Administered 2020-06-09: 5 mg via ORAL
  Filled 2020-06-09: qty 1

## 2020-06-09 MED ORDER — MIDAZOLAM HCL 2 MG/2ML IJ SOLN
INTRAMUSCULAR | Status: AC
  Filled 2020-06-09: qty 2

## 2020-06-09 MED ORDER — LIDOCAINE 2% (20 MG/ML) 5 ML SYRINGE
INTRAMUSCULAR | Status: DC | PRN
  Administered 2020-06-09: 100 mg via INTRAVENOUS

## 2020-06-09 MED ORDER — FENTANYL CITRATE (PF) 250 MCG/5ML IJ SOLN
INTRAMUSCULAR | Status: AC
  Filled 2020-06-09: qty 5

## 2020-06-09 MED ORDER — SUGAMMADEX SODIUM 500 MG/5ML IV SOLN
INTRAVENOUS | Status: AC
  Filled 2020-06-09: qty 5

## 2020-06-09 MED ORDER — DEXAMETHASONE SODIUM PHOSPHATE 10 MG/ML IJ SOLN
INTRAMUSCULAR | Status: DC | PRN
  Administered 2020-06-09: 8 mg via INTRAVENOUS

## 2020-06-09 MED ORDER — SIMVASTATIN 40 MG PO TABS
40.0000 mg | ORAL_TABLET | Freq: Every day | ORAL | Status: DC
  Administered 2020-06-09: 40 mg via ORAL
  Filled 2020-06-09: qty 1

## 2020-06-09 MED ORDER — ROCURONIUM BROMIDE 10 MG/ML (PF) SYRINGE
PREFILLED_SYRINGE | INTRAVENOUS | Status: DC | PRN
  Administered 2020-06-09: 20 mg via INTRAVENOUS
  Administered 2020-06-09: 40 mg via INTRAVENOUS
  Administered 2020-06-09: 60 mg via INTRAVENOUS

## 2020-06-09 MED ORDER — SODIUM CHLORIDE (PF) 0.9 % IJ SOLN
INTRAMUSCULAR | Status: AC
  Filled 2020-06-09: qty 10

## 2020-06-09 MED ORDER — SENNOSIDES-DOCUSATE SODIUM 8.6-50 MG PO TABS
2.0000 | ORAL_TABLET | Freq: Every day | ORAL | Status: DC
  Administered 2020-06-09: 2 via ORAL
  Filled 2020-06-09: qty 2

## 2020-06-09 MED ORDER — LIDOCAINE HCL (PF) 2 % IJ SOLN
INTRAMUSCULAR | Status: AC
  Filled 2020-06-09: qty 5

## 2020-06-09 MED ORDER — ONDANSETRON HCL 4 MG/2ML IJ SOLN
4.0000 mg | INTRAMUSCULAR | Status: DC | PRN
  Administered 2020-06-09 (×2): 4 mg via INTRAVENOUS
  Filled 2020-06-09 (×2): qty 2

## 2020-06-09 MED ORDER — MIDAZOLAM HCL 2 MG/2ML IJ SOLN
INTRAMUSCULAR | Status: DC | PRN
  Administered 2020-06-09: 2 mg via INTRAVENOUS

## 2020-06-09 MED ORDER — PROPOFOL 10 MG/ML IV BOLUS
INTRAVENOUS | Status: DC | PRN
  Administered 2020-06-09: 170 mg via INTRAVENOUS

## 2020-06-09 MED ORDER — PROMETHAZINE HCL 25 MG/ML IJ SOLN: 6.2500 mg | INTRAMUSCULAR | Status: DC | PRN

## 2020-06-09 MED ORDER — DEXTROSE-NACL 5-0.45 % IV SOLN: INTRAVENOUS | Status: DC

## 2020-06-09 MED ORDER — SODIUM CHLORIDE (PF) 0.9 % IJ SOLN
INTRAMUSCULAR | Status: DC | PRN
  Administered 2020-06-09: 20 mL via INTRAVENOUS

## 2020-06-09 MED ORDER — POTASSIUM CHLORIDE CRYS ER 10 MEQ PO TBCR
10.0000 meq | EXTENDED_RELEASE_TABLET | Freq: Every day | ORAL | Status: DC
  Administered 2020-06-09 – 2020-06-10 (×2): 10 meq via ORAL
  Filled 2020-06-09 (×2): qty 1

## 2020-06-09 MED ORDER — PANTOPRAZOLE SODIUM 40 MG PO TBEC
40.0000 mg | DELAYED_RELEASE_TABLET | Freq: Every day | ORAL | Status: DC
  Administered 2020-06-09 – 2020-06-10 (×2): 40 mg via ORAL
  Filled 2020-06-09 (×2): qty 1

## 2020-06-09 MED ORDER — BUPIVACAINE LIPOSOME 1.3 % IJ SUSP
20.0000 mL | Freq: Once | INTRAMUSCULAR | Status: AC
  Administered 2020-06-09: 20 mL
  Filled 2020-06-09: qty 20

## 2020-06-09 MED ORDER — SUGAMMADEX SODIUM 200 MG/2ML IV SOLN
INTRAVENOUS | Status: DC | PRN
  Administered 2020-06-09: 300 mg via INTRAVENOUS

## 2020-06-09 MED ORDER — LACTATED RINGERS IV SOLN: INTRAVENOUS | Status: DC

## 2020-06-09 MED ORDER — BACITRACIN-NEOMYCIN-POLYMYXIN 400-5-5000 EX OINT: 1.0000 "application " | TOPICAL_OINTMENT | Freq: Three times a day (TID) | CUTANEOUS | Status: DC | PRN

## 2020-06-09 MED ORDER — FENTANYL CITRATE (PF) 100 MCG/2ML IJ SOLN
INTRAMUSCULAR | Status: DC | PRN
  Administered 2020-06-09 (×5): 50 ug via INTRAVENOUS

## 2020-06-09 MED ORDER — ACETAMINOPHEN 500 MG PO TABS
1000.0000 mg | ORAL_TABLET | Freq: Once | ORAL | Status: AC
  Administered 2020-06-09: 1000 mg via ORAL
  Filled 2020-06-09: qty 2

## 2020-06-09 MED ORDER — DIPHENHYDRAMINE HCL 12.5 MG/5ML PO ELIX: 12.5000 mg | ORAL_SOLUTION | Freq: Four times a day (QID) | ORAL | Status: DC | PRN

## 2020-06-09 MED ORDER — HYDROCODONE-ACETAMINOPHEN 5-325 MG PO TABS
1.0000 | ORAL_TABLET | Freq: Four times a day (QID) | ORAL | 0 refills | Status: DC | PRN
Start: 2020-06-09 — End: 2020-07-21

## 2020-06-09 MED ORDER — LACTATED RINGERS IR SOLN
Status: DC | PRN
  Administered 2020-06-09: 1000 mL

## 2020-06-09 MED ORDER — SUCCINYLCHOLINE CHLORIDE 200 MG/10ML IV SOSY
PREFILLED_SYRINGE | INTRAVENOUS | Status: AC
  Filled 2020-06-09: qty 10

## 2020-06-09 MED ORDER — FENTANYL CITRATE (PF) 100 MCG/2ML IJ SOLN
INTRAMUSCULAR | Status: AC
  Administered 2020-06-09: 50 ug via INTRAVENOUS
  Filled 2020-06-09: qty 2

## 2020-06-09 MED ORDER — ONDANSETRON HCL 4 MG/2ML IJ SOLN
INTRAMUSCULAR | Status: DC | PRN
  Administered 2020-06-09: 4 mg via INTRAVENOUS

## 2020-06-09 MED ORDER — SUCCINYLCHOLINE CHLORIDE 200 MG/10ML IV SOSY
PREFILLED_SYRINGE | INTRAVENOUS | Status: DC | PRN
  Administered 2020-06-09: 140 mg via INTRAVENOUS

## 2020-06-09 MED ORDER — CEFAZOLIN SODIUM-DEXTROSE 2-4 GM/100ML-% IV SOLN
INTRAVENOUS | Status: AC
  Filled 2020-06-09: qty 100

## 2020-06-09 MED ORDER — ACETAMINOPHEN 500 MG PO TABS
1000.0000 mg | ORAL_TABLET | Freq: Four times a day (QID) | ORAL | Status: AC
  Administered 2020-06-09 – 2020-06-10 (×4): 1000 mg via ORAL
  Filled 2020-06-09 (×5): qty 2

## 2020-06-09 MED ORDER — MAGNESIUM CITRATE PO SOLN: 1.0000 | Freq: Once | ORAL | Status: DC

## 2020-06-09 MED ORDER — DIPHENHYDRAMINE HCL 50 MG/ML IJ SOLN: 12.5000 mg | Freq: Four times a day (QID) | INTRAMUSCULAR | Status: DC | PRN

## 2020-06-09 MED ORDER — BELLADONNA ALKALOIDS-OPIUM 16.2-60 MG RE SUPP: 1.0000 | Freq: Four times a day (QID) | RECTAL | Status: DC | PRN

## 2020-06-09 MED ORDER — HYDROMORPHONE HCL 1 MG/ML IJ SOLN: 0.2500 mg | INTRAMUSCULAR | Status: DC | PRN

## 2020-06-09 MED ORDER — HYDROMORPHONE HCL 1 MG/ML IJ SOLN
INTRAMUSCULAR | Status: AC
  Administered 2020-06-09: 0.5 mg via INTRAVENOUS
  Filled 2020-06-09: qty 1

## 2020-06-09 MED ORDER — ONDANSETRON HCL 4 MG/2ML IJ SOLN
INTRAMUSCULAR | Status: AC
  Filled 2020-06-09: qty 2

## 2020-06-09 MED ORDER — FENTANYL CITRATE (PF) 100 MCG/2ML IJ SOLN
INTRAMUSCULAR | Status: AC
  Administered 2020-06-09: 25 ug via INTRAVENOUS
  Filled 2020-06-09: qty 2

## 2020-06-09 MED ORDER — FENTANYL CITRATE (PF) 100 MCG/2ML IJ SOLN
25.0000 ug | INTRAMUSCULAR | Status: DC | PRN
  Administered 2020-06-09: 50 ug via INTRAVENOUS
  Administered 2020-06-09: 25 ug via INTRAVENOUS

## 2020-06-09 MED ORDER — STERILE WATER FOR IRRIGATION IR SOLN
Status: DC | PRN
  Administered 2020-06-09: 1000 mL

## 2020-06-09 MED ORDER — DEXAMETHASONE SODIUM PHOSPHATE 10 MG/ML IJ SOLN
INTRAMUSCULAR | Status: AC
  Filled 2020-06-09: qty 1

## 2020-06-09 MED ORDER — OXYCODONE HCL 5 MG/5ML PO SOLN: 5.0000 mg | Freq: Once | ORAL | Status: DC | PRN

## 2020-06-09 MED ORDER — PROPOFOL 10 MG/ML IV BOLUS
INTRAVENOUS | Status: AC
  Filled 2020-06-09: qty 20

## 2020-06-09 SURGICAL SUPPLY — 62 items
ADH SKN CLS APL DERMABOND .7 (GAUZE/BANDAGES/DRESSINGS) ×1
APL PRP STRL LF DISP 70% ISPRP (MISCELLANEOUS) ×1
BAG LAPAROSCOPIC 12 15 PORT 16 (BASKET) ×1 IMPLANT
BAG RETRIEVAL 12/15 (BASKET) ×2
BAG RETRIEVAL 12/15MM (BASKET) ×1
CHLORAPREP W/TINT 26 (MISCELLANEOUS) ×3 IMPLANT
CLIP VESOLOCK LG 6/CT PURPLE (CLIP) ×3 IMPLANT
CLIP VESOLOCK MED LG 6/CT (CLIP) ×3 IMPLANT
CLIP VESOLOCK XL 6/CT (CLIP) ×3 IMPLANT
COVER SURGICAL LIGHT HANDLE (MISCELLANEOUS) ×3 IMPLANT
COVER TIP SHEARS 8 DVNC (MISCELLANEOUS) ×1 IMPLANT
COVER TIP SHEARS 8MM DA VINCI (MISCELLANEOUS) ×3
COVER WAND RF STERILE (DRAPES) ×3 IMPLANT
CUTTER ECHEON FLEX ENDO 45 340 (ENDOMECHANICALS) ×2 IMPLANT
DECANTER SPIKE VIAL GLASS SM (MISCELLANEOUS) ×3 IMPLANT
DERMABOND ADVANCED (GAUZE/BANDAGES/DRESSINGS) ×2
DERMABOND ADVANCED .7 DNX12 (GAUZE/BANDAGES/DRESSINGS) ×2 IMPLANT
DRAIN CHANNEL 15F RND FF 3/16 (WOUND CARE) IMPLANT
DRAPE ARM DVNC X/XI (DISPOSABLE) ×4 IMPLANT
DRAPE COLUMN DVNC XI (DISPOSABLE) ×1 IMPLANT
DRAPE DA VINCI XI ARM (DISPOSABLE) ×12
DRAPE DA VINCI XI COLUMN (DISPOSABLE) ×3
DRAPE INCISE IOBAN 66X45 STRL (DRAPES) ×3 IMPLANT
DRAPE SHEET LG 3/4 BI-LAMINATE (DRAPES) ×3 IMPLANT
ELECT PENCIL ROCKER SW 15FT (MISCELLANEOUS) ×3 IMPLANT
ELECT REM PT RETURN 15FT ADLT (MISCELLANEOUS) ×3 IMPLANT
EVACUATOR SILICONE 100CC (DRAIN) IMPLANT
GLOVE BIO SURGEON STRL SZ 6.5 (GLOVE) ×2 IMPLANT
GLOVE BIO SURGEONS STRL SZ 6.5 (GLOVE) ×1
GLOVE BIOGEL M STRL SZ7.5 (GLOVE) ×8 IMPLANT
GOWN STRL REUS W/TWL LRG LVL3 (GOWN DISPOSABLE) ×9 IMPLANT
IRRIG SUCT STRYKERFLOW 2 WTIP (MISCELLANEOUS) ×3
IRRIGATION SUCT STRKRFLW 2 WTP (MISCELLANEOUS) ×1 IMPLANT
KIT BASIN OR (CUSTOM PROCEDURE TRAY) ×3 IMPLANT
KIT TURNOVER KIT A (KITS) IMPLANT
LOOP VESSEL MAXI BLUE (MISCELLANEOUS) IMPLANT
NDL INSUFFLATION 14GA 120MM (NEEDLE) ×1 IMPLANT
NEEDLE INSUFFLATION 14GA 120MM (NEEDLE) ×3 IMPLANT
PENCIL SMOKE EVACUATOR (MISCELLANEOUS) IMPLANT
PORT ACCESS TROCAR AIRSEAL 12 (TROCAR) ×1 IMPLANT
PORT ACCESS TROCAR AIRSEAL 5M (TROCAR) ×2
PROTECTOR NERVE ULNAR (MISCELLANEOUS) ×6 IMPLANT
RELOAD STAPLE 45 2.6 WHT THIN (STAPLE) IMPLANT
SEAL CANN UNIV 5-8 DVNC XI (MISCELLANEOUS) ×4 IMPLANT
SEAL XI 5MM-8MM UNIVERSAL (MISCELLANEOUS) ×12
SET TRI-LUMEN FLTR TB AIRSEAL (TUBING) ×3 IMPLANT
SOLUTION ELECTROLUBE (MISCELLANEOUS) ×3 IMPLANT
SPONGE LAP 4X18 RFD (DISPOSABLE) ×3 IMPLANT
STAPLE RELOAD 45 WHT (STAPLE) ×5 IMPLANT
STAPLE RELOAD 45MM WHITE (STAPLE) ×15
SUT ETHILON 3 0 PS 1 (SUTURE) IMPLANT
SUT MNCRL AB 4-0 PS2 18 (SUTURE) ×6 IMPLANT
SUT PDS AB 1 CT1 27 (SUTURE) ×11 IMPLANT
SUT VICRYL 0 UR6 27IN ABS (SUTURE) ×2 IMPLANT
TOWEL OR 17X26 10 PK STRL BLUE (TOWEL DISPOSABLE) ×3 IMPLANT
TOWEL OR NON WOVEN STRL DISP B (DISPOSABLE) ×3 IMPLANT
TRAY FOLEY MTR SLVR 16FR STAT (SET/KITS/TRAYS/PACK) ×3 IMPLANT
TRAY LAPAROSCOPIC (CUSTOM PROCEDURE TRAY) ×3 IMPLANT
TROCAR BLADELESS OPT 5 100 (ENDOMECHANICALS) IMPLANT
TROCAR UNIVERSAL OPT 12M 100M (ENDOMECHANICALS) ×3 IMPLANT
TROCAR XCEL 12X100 BLDLESS (ENDOMECHANICALS) ×3 IMPLANT
WATER STERILE IRR 1000ML POUR (IV SOLUTION) ×3 IMPLANT

## 2020-06-09 NOTE — Discharge Instructions (Signed)

## 2020-06-09 NOTE — H&P (Signed)
Justin Cross is an 64 y.o. male.    Chief Complaint: Pre-Op LEFT Radical Nephrectomy  HPI:   1 - Left Renal Mass - 6cm left renal mass 04/2020, enhancing with some stranding and parasitic vessels c/w primary renal cancer. No local adenopathy. 1 artery / 1 vein (lumbar below artery) left renovascular anatomy. cr 1.0.   2 - Right Non-Complex Renal Cysts - few Rt sided cortical cysts w/o mass effect or enhancing nodules on CT 04/2020. Dominant RLP 4cm.   PMH sig for laerge obestiy, CLL (remision x years, follows Justin Bers MD med-onc in [place] ), HTN, GERD, neuropathy / gabbapentin (follows neurol). His PCP is Justin Che MD.   Today " Justin Cross " is seen to proceed with LEFT radical nephrectomy for large left renal mass. His insurance company has played brinkmanship with approval despite submitting records a month ago. No interval fevers. Cards clearance on file with recdent chemicl stress test. C19 screen negative.   Past Medical History:  Diagnosis Date  . Anginal pain (HCC)    reflux  . B12 deficiency   . Cancer ([place])   . Chest pain   . 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 CLL (chronic lymphoblastic leukemia)   . Constipation   . Dyslipidemia   . Dysrhythmia    "racing heart"  . GERD (gastroesophageal reflux disease)   . High blood pressure   . Hyperlipidemia   . Leg pain   . Lumbosacral plexopathy    12/30/19 - estimates 18-20 years  . Muscle spasm 12/30/2019  . Obesity   . OSA (obstructive sleep apnea)   . Pre-diabetes   . Reflux   . Restless leg   . Seasonal allergies    Hay fever  . Sexual dysfunction   . Sleep apnea   . Ulcer   . Urinary problem     Past Surgical History:  Procedure Laterality Date  . [place]?  Marland Kitchen NOSE SURGERY  1977/78   Realignment  . SKIN SURGERY     Basal cell X3    Family History  Problem Relation Age of Onset  . Heart disease Father   . Hypertension Mother   . Diabetes Mother   .  Diabetes Brother   . Coronary artery disease Neg Hx    Social History:  reports that he quit smoking about 39 years ago. His smoking use included cigarettes. He has a 0.75 pack-year smoking history. He has never used smokeless tobacco. He reports that he does not drink alcohol and does not use drugs.  Allergies:  Allergies  Allergen Reactions  . Daypro [Oxaprozin] Rash    Medications Prior to Admission  Medication Sig Dispense Refill  . acetaminophen (TYLENOL) 500 MG tablet Take 1,000 mg by mouth every 6 (six) hours as needed for mild pain or headache.    . Ascorbic Acid (VITAMIN C PO) Take 500 mg by mouth daily.     Marland Kitchen b complex vitamins capsule Take 1 capsule by mouth daily.     Marland Kitchen dexlansoprazole (DEXILANT) 60 MG capsule Take 60 mg by mouth daily.    . Flaxseed, Linseed, (FLAX SEEDS PO) Take 1,000 mg by mouth daily.     Marland Kitchen gabapentin (NEURONTIN) 300 MG capsule Take 600 mg by mouth at bedtime.     Marland Kitchen MAGNESIUM PO Take 665 mg by mouth daily.     . metaxalone (SKELAXIN) 800 MG tablet Take 400 mg by mouth daily as needed  for muscle spasms.     . Potassium (POTASSIMIN PO) Take 99 mg by mouth daily.     . Potassium 99 MG TABS Take by mouth.    . simvastatin (ZOCOR) 40 MG tablet Take 40 mg by mouth at bedtime.    Marland Kitchen telmisartan-hydrochlorothiazide (MICARDIS HCT) 40-12.5 MG tablet Take 1 tablet by mouth daily.    . traMADol (ULTRAM) 50 MG tablet Take 50 mg by mouth daily at 12 noon.     . Zinc 50 MG CAPS Take 50 mg by mouth daily.       No results found for this or any previous visit (from the past 48 hour(s)). No results found.  Review of Systems  Constitutional: Negative for chills and fever.  All other systems reviewed and are negative.   Blood pressure (!) 152/84, pulse 78, temperature 98.7 F (37.1 C), temperature source Oral, resp. rate 18, weight (!) 145.6 kg, SpO2 100 %. Physical Exam Vitals reviewed.  HENT:     Head: Normocephalic.     Nose: Nose normal.  Eyes:     Pupils:  Pupils are equal, round, and reactive to light.  Cardiovascular:     Rate and Rhythm: Normal rate.  Pulmonary:     Effort: Pulmonary effort is normal.  Abdominal:     Comments: Very large truncal obesity limits sensitivity of exam.   Genitourinary:    Comments: No CVAT at present.  Musculoskeletal:     Cervical back: Normal range of motion.  Skin:    General: Skin is warm.  Neurological:     General: No focal deficit present.     Mental Status: He is alert.  Psychiatric:        Mood and Affect: Mood normal.      Assessment/Plan  Proceed as planned with LEFT robotic radical nephrectomy. Risks, benefits, alternatives, expected peri-op course discussed previously and reiterated today. His large obesity places him at increased risk of ALL complications including infectious, CV, and mortality.    Alexis Frock, MD 06/09/2020, 8:00 AM

## 2020-06-09 NOTE — Brief Op Note (Signed)
06/09/2020  11:02 AM  PATIENT:  Justin Cross  64 y.o. male  PRE-OPERATIVE DIAGNOSIS:  LEFT RENAL MASS  POST-OPERATIVE DIAGNOSIS:  LEFT RENAL MASS  PROCEDURE:  Procedure(s) with comments: XI ROBOTIC ASSISTED LAPAROSCOPIC RADICAL NEPHRECTOMY (Left) - 3 HRS  SURGEON:  Surgeon(s) and Role:    Alexis Frock, MD - Primary  PHYSICIAN ASSISTANT:   ASSISTANTS: Debbrah Alar PA   ANESTHESIA:   general  EBL:  30 mL   BLOOD ADMINISTERED:none  DRAINS: foley to gravity   LOCAL MEDICATIONS USED:  MARCAINE     SPECIMEN:  Source of Specimen:  LEFT radical nephrectomy  DISPOSITION OF SPECIMEN:  PATHOLOGY  COUNTS:  YES  TOURNIQUET:  * No tourniquets in log *  DICTATION: .Other Dictation: Dictation Number  [id number]  PLAN OF CARE: Admit for overnight observation  PATIENT DISPOSITION:  PACU - hemodynamically stable.   Delay start of Pharmacological VTE agent (>24hrs) due to surgical blood loss or risk of bleeding: yes

## 2020-06-09 NOTE — Transfer of Care (Signed)
Immediate Anesthesia Transfer of Care Note  Patient: Justin Cross  Procedure(s) Performed: XI ROBOTIC ASSISTED LAPAROSCOPIC RADICAL NEPHRECTOMY (Left Abdomen)  Patient Location: PACU  Anesthesia Type:General  Level of Consciousness: awake, alert  and oriented  Airway & Oxygen Therapy: Patient Spontanous Breathing and Patient connected to face mask oxygen  Post-op Assessment: Report given to RN, Post -op Vital signs reviewed and stable and Patient moving all extremities X 4  Post vital signs: Reviewed and stable  Last Vitals:  Vitals Value Taken Time  BP    Temp    Pulse 79 06/09/20 1115  Resp 21 06/09/20 1115  SpO2 95 % 06/09/20 1115  Vitals shown include unvalidated device data.  Last Pain:  Vitals:   06/09/20 0713  TempSrc: Oral  PainSc:          Complications: No complications documented.

## 2020-06-09 NOTE — Anesthesia Postprocedure Evaluation (Signed)
Anesthesia Post Note  Patient: Justin Cross  Procedure(s) Performed: XI ROBOTIC ASSISTED LAPAROSCOPIC RADICAL NEPHRECTOMY (Left Abdomen)     Patient location during evaluation: PACU Anesthesia Type: General Level of consciousness: awake Pain management: pain level controlled Vital Signs Assessment: post-procedure vital signs reviewed and stable Respiratory status: spontaneous breathing, nonlabored ventilation, respiratory function stable and patient connected to nasal cannula oxygen Cardiovascular status: blood pressure returned to baseline and stable Postop Assessment: no apparent nausea or vomiting Anesthetic complications: no   No complications documented.  Last Vitals:  Vitals:   06/09/20 1736 06/09/20 1749  BP: 134/77   Pulse: 80   Resp: 18   Temp: 36.5 C   SpO2: 94% 93%    Last Pain:  Vitals:   06/09/20 1750  TempSrc:   PainSc: 6                  Elizette Shek P Beni Turrell

## 2020-06-09 NOTE — Anesthesia Procedure Notes (Signed)
Procedure Name: Intubation Date/Time: 06/09/2020 8:38 AM Performed by: Niel Hummer, CRNA Pre-anesthesia Checklist: Patient identified, Emergency Drugs available, Suction available and Patient being monitored Patient Re-evaluated:Patient Re-evaluated prior to induction Oxygen Delivery Method: Circle system utilized Preoxygenation: Pre-oxygenation with 100% oxygen Induction Type: IV induction and Rapid sequence Laryngoscope Size: Mac and 4 Grade View: Grade II Tube type: Oral Tube size: 7.5 mm Number of attempts: 1 Airway Equipment and Method: Stylet Placement Confirmation: ETT inserted through vocal cords under direct vision,  breath sounds checked- equal and bilateral and CO2 detector Secured at: 23 cm Tube secured with: Tape Dental Injury: Teeth and Oropharynx as per pre-operative assessment

## 2020-06-10 ENCOUNTER — Encounter (HOSPITAL_COMMUNITY): Payer: Self-pay | Admitting: Urology

## 2020-06-10 LAB — HEMOGLOBIN AND HEMATOCRIT, BLOOD
HCT: 40.6 % (ref 39.0–52.0)
Hemoglobin: 13.6 g/dL (ref 13.0–17.0)

## 2020-06-10 LAB — BASIC METABOLIC PANEL
Anion gap: 4 — ABNORMAL LOW (ref 5–15)
BUN: 13 mg/dL (ref 8–23)
CO2: 27 mmol/L (ref 22–32)
Calcium: 8.6 mg/dL — ABNORMAL LOW (ref 8.9–10.3)
Chloride: 103 mmol/L (ref 98–111)
Creatinine, Ser: 1.41 mg/dL — ABNORMAL HIGH (ref 0.61–1.24)
GFR, Estimated: 56 mL/min — ABNORMAL LOW (ref >60)
Glucose, Bld: 160 mg/dL — ABNORMAL HIGH (ref 70–99)
Potassium: 4.1 mmol/L (ref 3.5–5.1)
Sodium: 134 mmol/L — ABNORMAL LOW (ref 135–145)

## 2020-06-10 LAB — SURGICAL PATHOLOGY

## 2020-06-10 MED ORDER — SENNOSIDES-DOCUSATE SODIUM 8.6-50 MG PO TABS: 1.0000 | ORAL_TABLET | Freq: Two times a day (BID) | ORAL | 0 refills | Status: DC

## 2020-06-10 MED ORDER — TRAMADOL HCL 50 MG PO TABS
50.0000 mg | ORAL_TABLET | Freq: Four times a day (QID) | ORAL | Status: DC | PRN
  Administered 2020-06-10: 50 mg via ORAL
  Filled 2020-06-10: qty 1

## 2020-06-10 NOTE — Discharge Summary (Signed)
Physician Discharge Summary  Patient ID: Justin Cross MRN: [id number] DOB/AGE: [birth year] 64 y.o.  Admit date: 06/09/2020 Discharge date: 06/10/2020  Admission Diagnoses: LEFT Renal Neoplasm  Discharge Diagnoses:  Active Problems:   Renal mass   Discharged Condition: good  Hospital Course: Pt underwent uncomplicated LEFT robotic radical nephrectomy on 06/09/20, the day of admission, without acute complication. He was admitted to the Urology service post-op. By the afternoon of POD 1, the day of discharge, he is ambulatory, tolerating PO intake, pain controlled on PO meds, and felt to be adequate for discharge. Cr 1.4, Hgb 13.6, Path pending at discharge.   Consults: None  Significant Diagnostic Studies: labs: as per above  Treatments: surgery: as per above  Discharge Exam: Blood pressure 123/74, pulse 75, temperature 97.8 F (36.6 C), temperature source Oral, resp. rate 18, height 6' 2.5" (1.892 m), weight (!) 143.7 kg, SpO2 100 %. General appearance: alert, cooperative and son at bedside. Pt wearing sneakers and walking in the room.  Eyes: negative Nose: Nares normal. Septum midline. Mucosa normal. No drainage or sinus tenderness. Throat: lips, mucosa, and tongue normal; teeth and gums normal Neck: supple, symmetrical, trachea midline Back: symmetric, no curvature. ROM normal. No CVA tenderness. Resp: Non-labored on room air.  Cardio: Nl rate GI: soft, non-tender; bowel sounds normal; no masses,  no organomegaly and stable truncal obesity. Recent port and extraction sites c/d/i.  Male genitalia: normal Extremities: extremities normal, atraumatic, no cyanosis or edema Skin: Skin color, texture, turgor normal. No rashes or lesions Lymph nodes: Cervical, supraclavicular, and axillary nodes normal. Neurologic: Grossly normal  Disposition: HOME   Allergies as of 06/10/2020      Reactions   Daypro [oxaprozin] Rash      Medication List    STOP taking these medications    b complex vitamins capsule   FLAX SEEDS PO   MAGNESIUM PO   POTASSIMIN PO   Potassium 99 MG Tabs   traMADol 50 MG tablet Commonly known as: ULTRAM   VITAMIN C PO   Zinc 50 MG Caps     TAKE these medications   acetaminophen 500 MG tablet Commonly known as: TYLENOL Take 1,000 mg by mouth every 6 (six) hours as needed for mild pain or headache.   dexlansoprazole 60 MG capsule Commonly known as: DEXILANT Take 60 mg by mouth daily.   gabapentin 300 MG capsule Commonly known as: NEURONTIN Take 600 mg by mouth at bedtime.   HYDROcodone-acetaminophen 5-325 MG tablet Commonly known as: Norco Take 1-2 tablets by mouth every 6 (six) hours as needed for moderate pain or severe pain.   metaxalone 800 MG tablet Commonly known as: SKELAXIN Take 400 mg by mouth daily as needed for muscle spasms.   senna-docusate 8.6-50 MG tablet Commonly known as: Senokot-S Take 1 tablet by mouth 2 (two) times daily. While taking strong pain meds to prevent constipation   simvastatin 40 MG tablet Commonly known as: ZOCOR Take 40 mg by mouth at bedtime.   telmisartan-hydrochlorothiazide 40-12.5 MG tablet Commonly known as: MICARDIS HCT Take 1 tablet by mouth daily.       Follow-up Information    Alexis Frock, MD On 06/24/2020.   Specialty: Urology Why: at 10:15 AM for MD visit and pathology review.  Contact information: [address] [place] [address] [phone]               Signed: Alexis Frock 06/10/2020, 1:18 PM

## 2020-06-10 NOTE — Op Note (Signed)
NAMEADRIAN, [name] MEDICAL RECORD PJ:[id number] ACCOUNT [id number] DATE OF BIRTH:1956-04-09 FACILITY: [facility] LOCATION: [facility] PHYSICIAN:Michaelina Blandino, MD  OPERATIVE REPORT  DATE OF PROCEDURE:  06/09/2020  PREOPERATIVE DIAGNOSIS:  Large left renal mass.  PROCEDURE:  Left robotic radical nephrectomy.  ESTIMATED BLOOD LOSS:  100 mL.  COMPLICATIONS:  None.  SPECIMENS:  Left radical nephrectomy for pathology.  FINDINGS: 1.  Single artery, single vein left renal vascular anatomy with several lumbar and prostatic vessels. 2.  Significant volume of retroperitoneal fat as anticipated.  INDICATIONS:  The patient is a pleasant 64 year old man who was found on workup of hematuria to have a significant left renal mass approximately 6 cm.  This was solid enhancing and quite endophytic.  Contralateral kidney was unremarkable for worrisome  lesions.  Overall, renal function is acceptable.  Options were discussed for management including recommended path of curative intent radical nephrectomy.  He wished to proceed.  Informed consent was obtained and placed in medical record.  Notably, he  has cardiac clearance and very stable CLL.  DESCRIPTION OF PROCEDURE:  The patient being Justin Cross, procedure being left radical nephrectomy was confirmed.  Procedure timeout was performed.  Intravenous antibiotics were administered.  General endotracheal anesthesia was induced.  The patient was  placed in the left side up full plank position, pulling 15 degrees of table flexion, superior arm elevator, axillary roll, sequential compression devices, bottom leg bent, top leg straight.  He was further fastened to operative table using 3-inch tape  with foam padding across the supraxiphoid chest and his pelvis after Foley catheter was placed free to straight drain.  Next, a high-flow, low-pressure pneumoperitoneum was obtained using Veress technique in the left lower quadrant having passed the  aspiration and  drop test.  Next, an 8 mm robotic camera port was placed in position approximately 1 handbreadth inferior to the inferior costal margin.  Laparoscopic examination of peritoneal cavity revealed no significant adhesions, no visceral injury.   Distal ports were placed as follows:  Left subcostal 8 mm robotic port, left far lateral 8 mm robotic port approximately 1 handbreadth superomedial to the anterior iliac spine, left paramedian inferior robotic port approximately 2 handbreadths superior  to the pubic ramus, and two 12 mm assistant port sites in the midline, one approximately 3 fingerbreadths below the planned camera port and one 3 fingerbreadths superior being AirSeal type.  Robot was docked and passed electronic checks.  Initial  attention was directed at developing the retroperitoneum.  Incision was made lateral to the descending colon from the area of the splenic flexure towards the area of the internal ring and the colon was carefully swept medially.  There was a significant  amount of retroperitoneal mesenteric fat as anticipated.  Lower pole of kidney area was identified, placed on gentle lateral traction.  Dissection proceeded medial to this.   Left ureter and gonadal vessels were encountered and also placed on gentle  lateral traction.  Dissection proceeded within the triangle of these structures and the psoas musculature towards the area of the renal hilum.  Renal hilum was somewhat complex, single artery, single vein, but two significant lumbar vessels, one more  distal on the renal vein and one more proximal.  These did preclude optimal visualization to the renal artery.  As such, these vessels were controlled dominant using vascular stapler and the smaller one using Hem-o-lok clip proximal distal.  This  resulted in much better visualization of the dominant renal artery.  This was circumferentially mobilized  and controlled using extra large Hem-o-lok clip proximal, vascular stapler distal, which  then resulted in excellent visualization and mobilization  of the left renal vein, which was controlled using vascular stapler.  This resulted in excellent hemostatic control of the hilum.  Dissection then proceeded superiorly in a partial adrenal sparing fashion following the plane between the adrenal and  superior medial to the kidney using vascular stapler.  Superior attachments were taken down using cautery scissors as were the inferior attachments.  The ureter was doubly clipped and ligated as the gonadal vessels.  This completely freed up the very  large left radical nephrectomy specimen, which was partially placed into an extra large EndoCatch bag given the size of specimen.  Robot was then undocked.  Specimen was retrieved by connecting the previous 12 mm assistant port sites in the midline and  removing the specimen in its entirety setting aside for permanent pathology.  Extraction site was closed at fascia using figure-of-eight PDS x8 followed by reapproximation of Scarpa's with running Vicryl.  All incision sites were infiltrated with dilute  lipolyzed Marcaine and closed at the level of the skin using subcuticular Monocryl and Dermabond.  Procedure was terminated.  The patient tolerated the procedure well.  No immediate complications.  The patient was taken to postanesthesia care in stable  condition.  Plan for observation admission.   Please note, first assistant, Bari Mantis, was crucial for all portions of the surgery today.  She provided invaluable retraction, suctioning, vascular clipping, vascular stapling, and general first assistance.  IN/NUANCE  D:06/09/2020 T:06/09/2020 JOB:[id number]

## 2020-07-21 ENCOUNTER — Ambulatory Visit: Payer: Commercial Managed Care - PPO | Admitting: Cardiology

## 2020-07-21 ENCOUNTER — Encounter: Payer: Self-pay | Admitting: Cardiology

## 2020-07-21 ENCOUNTER — Other Ambulatory Visit: Payer: Self-pay

## 2020-07-21 VITALS — BP 134/80 | HR 68 | Ht 74.0 in | Wt 314.0 lb

## 2020-07-21 DIAGNOSIS — E782 Mixed hyperlipidemia: Secondary | ICD-10-CM | POA: Diagnosis not present

## 2020-07-21 DIAGNOSIS — N2889 Other specified disorders of kidney and ureter: Secondary | ICD-10-CM | POA: Diagnosis not present

## 2020-07-21 DIAGNOSIS — I10 Essential (primary) hypertension: Secondary | ICD-10-CM | POA: Diagnosis not present

## 2020-07-21 DIAGNOSIS — R0789 Other chest pain: Secondary | ICD-10-CM | POA: Diagnosis not present

## 2020-07-21 NOTE — Addendum Note (Signed)
Addended by: Senaida Ores on: 07/21/2020 02:03 PM   Modules accepted: Orders

## 2020-07-21 NOTE — Patient Instructions (Signed)
Medication Instructions:  Your physician recommends that you continue on your current medications as directed. Please refer to the Current Medication list given to you today.  *If you need a refill on your cardiac medications before your next appointment, please call your pharmacy*   Lab Work: Your physician recommends that you return for lab work today: bmp  mg If you have labs (blood work) drawn today and your tests are completely normal, you will receive your results only by: Marland Kitchen MyChart Message (if you have MyChart) OR . A paper copy in the mail If you have any lab test that is abnormal or we need to change your treatment, we will call you to review the results.   Testing/Procedures: None   Follow-Up: At [facility], you and your health needs are our priority.  As part of our continuing mission to provide you with exceptional heart care, we have created designated Provider Care Teams.  These Care Teams include your primary Cardiologist (physician) and Advanced Practice Providers (APPs -  Physician Assistants and Nurse Practitioners) who all work together to provide you with the care you need, when you need it.  We recommend signing up for the patient portal called "MyChart".  Sign up information is provided on this After Visit Summary.  MyChart is used to connect with patients for Virtual Visits (Telemedicine).  Patients are able to view lab/test results, encounter notes, upcoming appointments, etc.  Non-urgent messages can be sent to your provider as well.   To learn more about what you can do with MyChart, go to [email or web address].    Your next appointment:   6 month(s)  The format for your next appointment:   In Person  Provider:   Jenne Campus, MD   Other Instructions

## 2020-07-21 NOTE — Progress Notes (Signed)
Cardiology Office Note:    Date:  07/21/2020   ID:  Benford, Asch 1956/04/13, MRN [id number]  PCP:  Street, Sharon Mt, MD  Cardiologist:  Jenne Campus, MD    Referring MD: Street, Sharon Mt, *   Chief Complaint  Patient presents with  . Follow-up  I am doing well  History of Present Illness:    Justin Cross is a 65 y.o. male who was referred to [place] originally for preop cardiovascular evaluation.  He required nephrectomy because of tumor.  He does have history of hypertension, atypical chest pain, obstructive sleep apnea, obesity.  Evaluation before surgery included stress testing which showed no evidence of ischemia, he also wore monitor which showed some frequent supraventricular ectopy, asymptomatic, he also got echocardiogram showed preserved left ventricle ejection fraction.  He went to surgery with no difficulty after that he was complaining of having some shoulder pain and he was told to have pain related to gas that was used to do surgery, after that he was complaining of some cramps but those are better.  He is gradually getting better denies have any chest pain tightness squeezing pressure burning chest overall doing well.  Past Medical History:  Diagnosis Date  . Anginal pain (HCC)    reflux  . B12 deficiency   . Cancer ([place])   . Chest pain   . 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 CLL (chronic lymphoblastic leukemia)   . Constipation   . Dyslipidemia   . Dysrhythmia    "racing heart"  . GERD (gastroesophageal reflux disease)   . High blood pressure   . Hyperlipidemia   . Leg pain   . Lumbosacral plexopathy    12/30/19 - estimates 18-20 years  . Muscle spasm 12/30/2019  . Obesity   . OSA (obstructive sleep apnea)   . Pre-diabetes   . Reflux   . Restless leg   . Seasonal allergies    Hay fever  . Sexual dysfunction   . Sleep apnea   . Ulcer   . Urinary problem     Past Surgical History:  Procedure Laterality  Date  . [place]?  Marland Kitchen NOSE SURGERY  1977/78   Realignment  . ROBOT ASSISTED LAPAROSCOPIC NEPHRECTOMY Left 06/09/2020   Procedure: XI ROBOTIC ASSISTED LAPAROSCOPIC RADICAL NEPHRECTOMY;  Surgeon: Alexis Frock, MD;  Location: [facility];  Service: Urology;  Laterality: Left;  3 HRS  . SKIN SURGERY     Basal cell X3    Current Medications: Current Meds  Medication Sig  . acetaminophen (TYLENOL) 500 MG tablet Take 1,000 mg by mouth every 6 (six) hours as needed for mild pain or headache.  . dexlansoprazole (DEXILANT) 60 MG capsule Take 60 mg by mouth daily.  Marland Kitchen gabapentin (NEURONTIN) 300 MG capsule Take 600 mg by mouth at bedtime.   . simvastatin (ZOCOR) 40 MG tablet Take 40 mg by mouth at bedtime.  Marland Kitchen telmisartan-hydrochlorothiazide (MICARDIS HCT) 40-12.5 MG tablet Take 1 tablet by mouth daily.  . traMADol (ULTRAM) 50 MG tablet Take 50 mg by mouth every 6 (six) hours as needed.     Allergies:   Daypro [oxaprozin]   Social History   Socioeconomic History  . Marital status: Married    Spouse name: Not on file  . Number of children: 2  . Years of education: college  . Highest education level: Associate degree: academic program  Occupational History  . 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 Smoking status: Former Smoker    Packs/day: 0.25    Years: 3.00    Pack years: 0.75    Types: Cigarettes    Quit date: 06/02/1981    Years since quitting: 39.1  . Smokeless tobacco: Never Used  Vaping Use  . Vaping Use: Never used  Substance and Sexual Activity  . Alcohol use: No  . Drug use: Never  . Sexual activity: Not on file  Other Topics Concern  . Not on file  Social History Narrative   Lives at home with wife.   Left-handed.   Caffeine: 1/3 of a 2-liter bottle of Diet Mountain Dew per day.   Social Determinants of Health   Financial Resource Strain: Not on file  Food Insecurity: Not on file  Transportation  Needs: Not on file  Physical Activity: Not on file  Stress: Not on file  Social Connections: Not on file     Family History: The patient's family history includes Diabetes in his brother and mother; Heart disease in his father; Hypertension in his mother. There is no history of Coronary artery disease. ROS:   Please see the history of present illness.    All 14 point review of systems negative except as described per history of present illness  EKGs/Labs/Other Studies Reviewed:      Recent Labs: 12/30/2019: ALT 22; TSH 1.990 06/02/2020: Platelets 159 06/10/2020: BUN 13; Creatinine, Ser 1.41; Hemoglobin 13.6; Potassium 4.1; Sodium 134  Recent Lipid Panel No results found for: CHOL, TRIG, HDL, CHOLHDL, VLDL, LDLCALC, LDLDIRECT  Physical Exam:    VS:  BP 134/80 (BP Location: Left Arm, Patient Position: Sitting)   Pulse 68   Ht 6\' 2"  (1.88 m)   Wt (!) 314 lb (142.4 kg)   SpO2 98%   BMI 40.32 kg/m     Wt Readings from Last 3 Encounters:  07/21/20 (!) 314 lb (142.4 kg)  06/09/20 (!) 316 lb 12.8 oz (143.7 kg)  06/02/20 (!) 321 lb (145.6 kg)     GEN:  Well nourished, well developed in no acute distress HEENT: Normal NECK: No JVD; No carotid bruits LYMPHATICS: No lymphadenopathy CARDIAC: RRR, no murmurs, no rubs, no gallops RESPIRATORY:  Clear to auscultation without rales, wheezing or rhonchi  ABDOMEN: Soft, non-tender, non-distended MUSCULOSKELETAL:  No edema; No deformity  SKIN: Warm and dry LOWER EXTREMITIES: no swelling NEUROLOGIC:  Alert and oriented x 3 PSYCHIATRIC:  Normal affect   ASSESSMENT:    1. Atypical chest pain   2. Mixed hyperlipidemia   3. Renal mass   4. Primary hypertension    PLAN:    In order of problems listed above:  1. Atypical chest pain stress test negative completely relieved by proton pump inhibitor.  Continue risk factors modifications. 2. Mixed dyslipidemia: I did review K PN which show me his LDL 68 HDL 31 we will continue present  management. 3. Renal mass status post surgery doing well recovering. 4. Cramps which is a new symptom.  Will check potassium magnesium. 5. Essential hypertension: Blood pressure well controlled continue present management.   Medication Adjustments/Labs and Tests Ordered: Current medicines are reviewed at length with the patient today.  Concerns regarding medicines are outlined above.  No orders of the defined types were placed in this encounter.  Medication changes: No orders of the defined types were placed in this encounter.   Signed, Park Liter, MD, [facility] 07/21/2020 1:59 PM  [facility]

## 2020-07-23 LAB — BASIC METABOLIC PANEL
BUN/Creatinine Ratio: 12 (ref 10–24)
BUN: 16 mg/dL (ref 8–27)
CO2: 23 mmol/L (ref 20–29)
Calcium: 9.4 mg/dL (ref 8.6–10.2)
Chloride: 96 mmol/L (ref 96–106)
Creatinine, Ser: 1.35 mg/dL — ABNORMAL HIGH (ref 0.76–1.27)
GFR calc Af Amer: 65 mL/min/{1.73_m2} (ref >59)
GFR calc non Af Amer: 56 mL/min/{1.73_m2} — ABNORMAL LOW (ref >59)
Glucose: 110 mg/dL — ABNORMAL HIGH (ref 65–99)
Potassium: 4.6 mmol/L (ref 3.5–5.2)
Sodium: 132 mmol/L — ABNORMAL LOW (ref 134–144)

## 2020-07-23 LAB — MAGNESIUM: Magnesium: 2 mg/dL (ref 1.6–2.3)

## 2020-11-04 ENCOUNTER — Other Ambulatory Visit: Payer: Self-pay | Admitting: Hematology and Oncology

## 2020-11-04 ENCOUNTER — Inpatient Hospital Stay: Payer: Commercial Managed Care - PPO | Attending: Hematology and Oncology

## 2020-11-04 ENCOUNTER — Inpatient Hospital Stay: Payer: Commercial Managed Care - PPO | Admitting: Hematology and Oncology

## 2020-11-04 DIAGNOSIS — C9110 Chronic lymphocytic leukemia of B-cell type not having achieved remission: Secondary | ICD-10-CM

## 2020-11-04 NOTE — Progress Notes (Deleted)
Justin Cross  [address] [place],  [place]  [address] [phone]  Clinic Day:  11/04/2020  Referring physician: Emmaline Kluver, *   CHIEF COMPLAINT:  CC: ***  Current Treatment:  ***   HISTORY OF PRESENT ILLNESS:  Justin Cross is a 65 y.o. male with a history of ***    INTERVAL HISTORY:  Justin Cross is here today for routine follow up of his ***. He denies fevers or chills. He denies pain. His appetite is good. His weight {Weight change:10426}.  REVIEW OF SYSTEMS:  Review of Systems - Oncology   VITALS:  There were no vitals taken for this visit.  Wt Readings from Last 3 Encounters:  07/21/20 (!) 314 lb (142.4 kg)  06/09/20 (!) 316 lb 12.8 oz (143.7 kg)  06/02/20 (!) 321 lb (145.6 kg)    There is no height or weight on file to calculate BMI.  Performance status (ECOG): {CHL ONC [id number]  PHYSICAL EXAM:  Physical Exam  LABS:   CBC Latest Ref Rng & Units 06/10/2020 06/09/2020 06/02/2020  WBC 4.0 - 10.5 K/uL - - 22.5(H)  Hemoglobin 13.0 - 17.0 g/dL 13.6 14.8 15.8  Hematocrit 39.0 - 52.0 % 40.6 44.3 46.6  Platelets 150 - 400 K/uL - - 159   CMP Latest Ref Rng & Units 07/22/2020 06/10/2020 06/02/2020  Glucose 65 - 99 mg/dL 110(H) 160(H) 149(H)  BUN 8 - 27 mg/dL 16 13 11   Creatinine 0.76 - 1.27 mg/dL 1.35(H) 1.41(H) 0.91  Sodium 134 - 144 mmol/L 132(L) 134(L) 137  Potassium 3.5 - 5.2 mmol/L 4.6 4.1 4.3  Chloride 96 - 106 mmol/L 96 103 105  CO2 20 - 29 mmol/L 23 27 24   Calcium 8.6 - 10.2 mg/dL 9.4 8.6(L) 9.0  Total Protein 6.0 - 8.5 g/dL - - -  Total Bilirubin 0.0 - 1.2 mg/dL - - -  Alkaline Phos 48 - 121 IU/L - - -  AST 0 - 40 IU/L - - -  ALT 0 - 44 IU/L - - -     No results found for: CEA1 / No results found for: CEA1 No results found for: PSA1 No results found for: FIE332 No results found for: RJJ884  No results found for: TOTALPROTELP, ALBUMINELP, A1GS, A2GS, BETS, BETA2SER, GAMS, MSPIKE, SPEI Lab Results   Component Value Date   FERRITIN 74 12/30/2019   No results found for: LDH  STUDIES:  No results found.    HISTORY:   Past Medical History:  Diagnosis Date  . Anginal pain (HCC)    reflux  . B12 deficiency   . Cancer ([place])   . Chest pain   . 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 CLL (chronic lymphoblastic leukemia)   . Constipation   . Dyslipidemia   . Dysrhythmia    "racing heart"  . GERD (gastroesophageal reflux disease)   . High blood pressure   . Hyperlipidemia   . Leg pain   . Lumbosacral plexopathy    12/30/19 - estimates 18-20 years  . Muscle spasm 12/30/2019  . Obesity   . OSA (obstructive sleep apnea)   . Pre-diabetes   . Reflux   . Restless leg   . Seasonal allergies    Hay fever  . Sexual dysfunction   . Sleep apnea   . Ulcer   . Urinary problem     Past Surgical History:  Procedure Laterality Date  . [place]?  Marland Kitchen  NOSE SURGERY  1977/78   Realignment  . ROBOT ASSISTED LAPAROSCOPIC NEPHRECTOMY Left 06/09/2020   Procedure: XI ROBOTIC ASSISTED LAPAROSCOPIC RADICAL NEPHRECTOMY;  Surgeon: Alexis Frock, MD;  Location: [facility];  Service: Urology;  Laterality: Left;  3 HRS  . SKIN SURGERY     Basal cell X3    Family History  Problem Relation Age of Onset  . Heart disease Father   . Hypertension Mother   . Diabetes Mother   . Diabetes Brother   . Coronary artery disease Neg Hx     Social History:  reports that he quit smoking about 39 years ago. His smoking use included cigarettes. He has a 0.75 pack-year smoking history. He has never used smokeless tobacco. He reports that he does not drink alcohol and does not use drugs.The patient is {Blank single:19197::"alone","accompanied by"} *** today.  Allergies:  Allergies  Allergen Reactions  . Daypro [Oxaprozin] Rash    Current Medications: Current Outpatient Medications  Medication Sig Dispense Refill  . acetaminophen (TYLENOL) 500 MG tablet Take 1,000 mg by  mouth every 6 (six) hours as needed for mild pain or headache.    . dexlansoprazole (DEXILANT) 60 MG capsule Take 60 mg by mouth daily.    Marland Kitchen gabapentin (NEURONTIN) 300 MG capsule Take 600 mg by mouth at bedtime.     . simvastatin (ZOCOR) 40 MG tablet Take 40 mg by mouth at bedtime.    Marland Kitchen telmisartan-hydrochlorothiazide (MICARDIS HCT) 40-12.5 MG tablet Take 1 tablet by mouth daily.    . traMADol (ULTRAM) 50 MG tablet Take 50 mg by mouth every 6 (six) hours as needed.     No current facility-administered medications for this visit.     ASSESSMENT & PLAN:   Assessment:  Justin Cross is a 65 y.o. male ***  Plan: ***  The patient understands the plans discussed today and is in agreement with them.  He knows to contact our office if he develops concerns prior to his next appointment.   I provided *** minutes (8:38 AM - 8:38 AM) of face-to-face time during this this encounter and > 50% was spent counseling as documented under my assessment and plan.    Marvia Pickles, PA-C

## 2020-12-01 ENCOUNTER — Other Ambulatory Visit (HOSPITAL_COMMUNITY): Payer: Self-pay | Admitting: Urology

## 2020-12-01 ENCOUNTER — Other Ambulatory Visit: Payer: Self-pay

## 2020-12-01 ENCOUNTER — Ambulatory Visit (HOSPITAL_COMMUNITY)
Admission: RE | Admit: 2020-12-01 | Discharge: 2020-12-01 | Disposition: A | Discharge status: Home / Self Care | Payer: Commercial Managed Care - PPO | Source: Ambulatory Visit | Attending: Urology | Admitting: Urology

## 2020-12-01 DIAGNOSIS — C642 Malignant neoplasm of left kidney, except renal pelvis: Secondary | ICD-10-CM | POA: Insufficient documentation

## 2021-04-28 DIAGNOSIS — I499 Cardiac arrhythmia, unspecified: Secondary | ICD-10-CM | POA: Insufficient documentation

## 2021-04-28 DIAGNOSIS — R7303 Prediabetes: Secondary | ICD-10-CM | POA: Insufficient documentation

## 2021-04-28 DIAGNOSIS — I209 Angina pectoris, unspecified: Secondary | ICD-10-CM | POA: Insufficient documentation

## 2021-04-29 ENCOUNTER — Ambulatory Visit: Payer: Commercial Managed Care - PPO | Admitting: Cardiology

## 2021-04-29 ENCOUNTER — Encounter: Payer: Self-pay | Admitting: Cardiology

## 2021-04-29 ENCOUNTER — Other Ambulatory Visit: Payer: Self-pay

## 2021-04-29 VITALS — BP 136/78 | HR 68 | Ht 74.5 in | Wt 324.0 lb

## 2021-04-29 DIAGNOSIS — G4733 Obstructive sleep apnea (adult) (pediatric): Secondary | ICD-10-CM

## 2021-04-29 DIAGNOSIS — E785 Hyperlipidemia, unspecified: Secondary | ICD-10-CM | POA: Diagnosis not present

## 2021-04-29 DIAGNOSIS — C8330 Diffuse large B-cell lymphoma, unspecified site: Secondary | ICD-10-CM

## 2021-04-29 DIAGNOSIS — I10 Essential (primary) hypertension: Secondary | ICD-10-CM

## 2021-04-29 DIAGNOSIS — B2799 Infectious mononucleosis, unspecified with other complication: Secondary | ICD-10-CM

## 2021-04-29 NOTE — Progress Notes (Signed)
Cardiology Office Note:    Date:  04/29/2021   ID:  Justin, Cross Sep 21, 1955, MRN [id number]  PCP:  Street, Sharon Mt, MD  Cardiologist:  Jenne Campus, MD    Referring MD: Street, Sharon Mt, *   Chief Complaint  Patient presents with   Follow-up  Doing well  History of Present Illness:    Justin Cross is a 65 y.o. male with past medical history significant for essential hypertension, renal cancer, status post nephrectomy, morbid obesity, hyperlipidemia, palpitations.  He was sent to [place] for evaluation before he is kidney surgery.  That evaluation included stress test which showed no evidence of ischemia.  Overall he is doing well.  He went to surgery with no major difficulties however he admits that the recovery was longer than anticipated.  Past Medical History:  Diagnosis Date   Anginal pain ([place])    reflux   B12 deficiency    Cancer ([place])    Chest pain    Chronic bilateral low back pain with sciatica 12/30/2019   CLL (chronic lymphoblastic leukemia)    Constipation    Dyslipidemia    Dysrhythmia    "racing heart"   GERD (gastroesophageal reflux disease)    High blood pressure    Hyperlipidemia    Leg pain    Lumbosacral plexopathy    12/30/19 - estimates 18-20 years   Muscle spasm 12/30/2019   Obesity    OSA (obstructive sleep apnea)    Pre-diabetes    Reflux    Restless leg    Seasonal allergies    Hay fever   Sexual dysfunction    Sleep apnea    Ulcer    Urinary problem     Past Surgical History:  Procedure Laterality Date   [place]?   NOSE SURGERY  1977/78   Realignment   ROBOT ASSISTED LAPAROSCOPIC NEPHRECTOMY Left 06/09/2020   Procedure: XI ROBOTIC ASSISTED LAPAROSCOPIC RADICAL NEPHRECTOMY;  Surgeon: Alexis Frock, MD;  Location: [facility];  Service: Urology;  Laterality: Left;  3 HRS   SKIN SURGERY     Basal cell X3    Current Medications: Current Meds  Medication Sig   acetaminophen (TYLENOL) 500 MG  tablet Take 1,000 mg by mouth every 6 (six) hours as needed for mild pain or headache.   dexlansoprazole (DEXILANT) 60 MG capsule Take 60 mg by mouth daily.   gabapentin (NEURONTIN) 300 MG capsule Take 600 mg by mouth at bedtime.    simvastatin (ZOCOR) 40 MG tablet Take 40 mg by mouth at bedtime.   telmisartan-hydrochlorothiazide (MICARDIS HCT) 40-12.5 MG tablet Take 1 tablet by mouth daily.   traMADol (ULTRAM) 50 MG tablet Take 50 mg by mouth every 6 (six) hours as needed.     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   Left-handed.   Caffeine: 1/3 of a 2-liter bottle of Diet Mountain Dew per day.   Social Determinants of Health   Financial Resource Strain: Not on file  Food Insecurity: Not on file  Transportation Needs: Not on file  Physical Activity: Not on file  Stress: Not on file  Social Connections: Not on file     Family History: The patient's family history includes Diabetes in his brother and mother; Heart disease in his father; Hypertension in his mother. There is no history of Coronary artery disease. ROS:   Please see the history of present illness.    All 14 point review of systems negative except as described per history of present  illness  EKGs/Labs/Other Studies Reviewed:      Recent Labs: 06/02/2020: Platelets 159 06/10/2020: Hemoglobin 13.6 07/22/2020: BUN 16; Creatinine, Ser 1.35; Magnesium 2.0; Potassium 4.6; Sodium 132  Recent Lipid Panel No results found for: CHOL, TRIG, HDL, CHOLHDL, VLDL, LDLCALC, LDLDIRECT  Physical Exam:    VS:  BP 136/78 (BP Location: Left Arm, Patient Position: Sitting)   Pulse 68   Ht 6' 2.5" (1.892 m)   Wt (!) 324 lb (147 kg)   SpO2 97%   BMI 41.04 kg/m     Wt Readings from Last 3 Encounters:  04/29/21 (!) 324 lb (147 kg)  07/21/20 (!) 314 lb (142.4 kg)  06/09/20 (!) 316 lb 12.8 oz (143.7 kg)     GEN:  Well nourished, well developed in no acute distress HEENT: Normal NECK: No JVD; No carotid bruits LYMPHATICS: No lymphadenopathy CARDIAC: RRR, no murmurs, no rubs, no gallops RESPIRATORY:  Clear to auscultation without rales, wheezing or rhonchi  ABDOMEN: Soft, non-tender, non-distended MUSCULOSKELETAL:  No edema; No deformity  SKIN: Warm and dry LOWER EXTREMITIES: no swelling NEUROLOGIC:  Alert and oriented x 3 PSYCHIATRIC:  Normal affect   ASSESSMENT:    1. Primary hypertension   2. OSA (obstructive sleep apnea)   3. Dyslipidemia   4. 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 Obstructive sleep apnea: That being followed by antimedicine team will continue monitoring Dyslipidemia I did review his K PN however the data is from a year ago.  He is scheduled within a week to have follow-up with his primary care physician who will do his cholesterol check we will follow-up on that Lymphoma apparently stable.   Medication Adjustments/Labs and Tests Ordered: Current medicines are reviewed at length with the patient today.  Concerns regarding medicines are outlined above.  No orders of the  defined types were placed in this encounter.  Medication changes: No orders of the defined types were placed in this encounter.   Signed, Park Liter, MD, [facility] 04/29/2021 4:22 PM    [place]

## 2021-04-29 NOTE — Patient Instructions (Signed)

## 2021-05-01 IMAGING — CT CT ABDOMEN WO/W CM
3 of 13 series · 9 of 46 positions shown, 15 images · IV contrast (OMNIPAQUE)
Comparison: CT abdomen and pelvis 02/19/2020 and 10/10/2009.

CLINICAL DATA: Renal mass on previous MRI.

EXAM:
CT ABDOMEN WITHOUT AND WITH CONTRAST
TECHNIQUE: Multidetector CT imaging of the abdomen was performed following the
standard protocol before and following the bolus administration of
intravenous contrast.
CONTRAST:  100mL OMNIPAQUE IOHEXOL 300 MG/ML  SOLN

[Series 2: axial pre · axial · non-contrast · 0.88mm/px · z∈[+1217,+1445]mm · 4 of 128 slices shown, 9 images]
[im 26/128  soft-tissue]
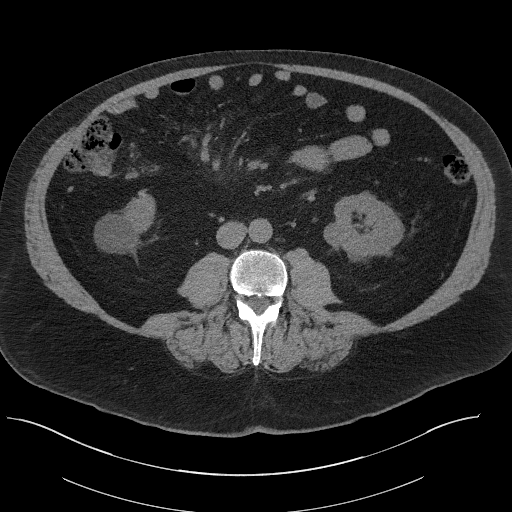
[im 26/128  lung]
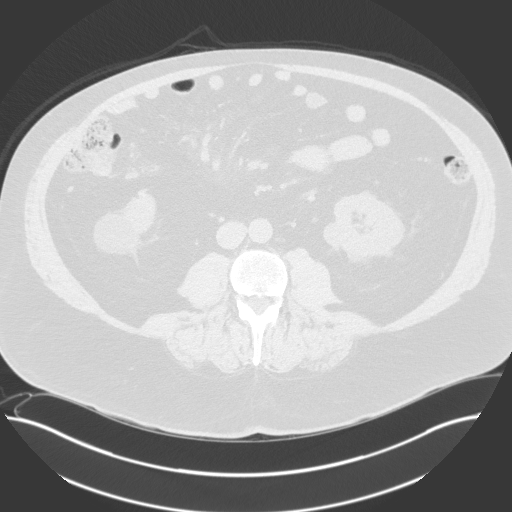
[im 26/128  bone]
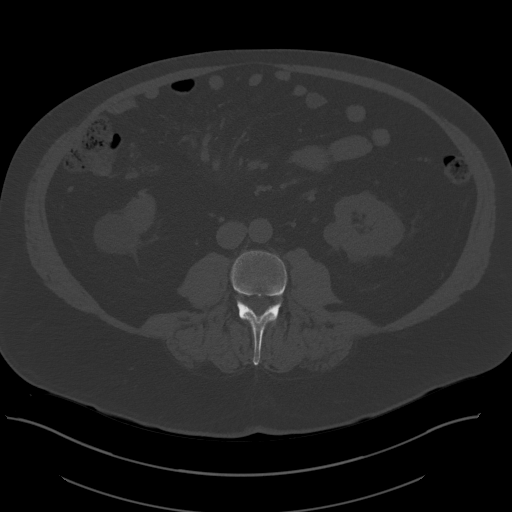
[im 51/128  soft-tissue]
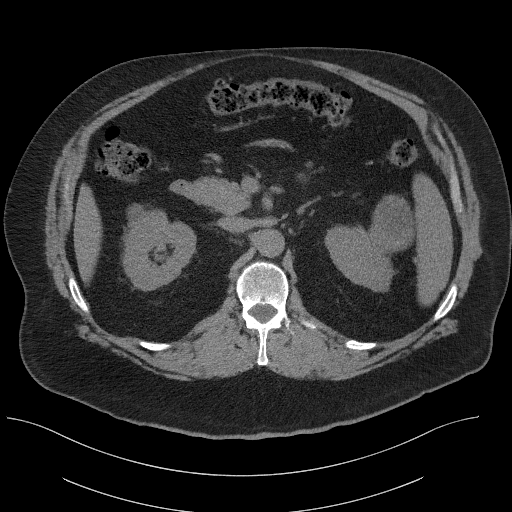
[im 51/128  lung]
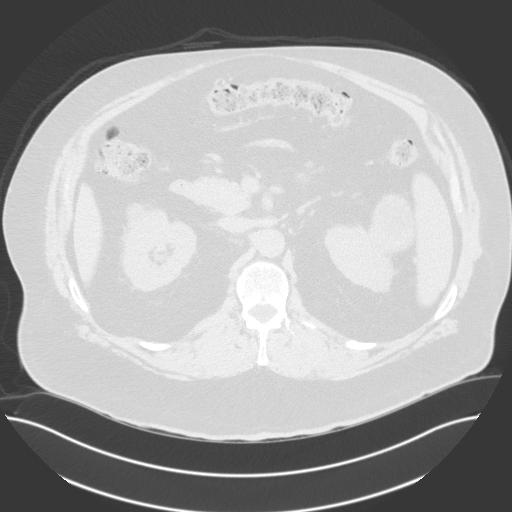
[im 77/128  soft-tissue]
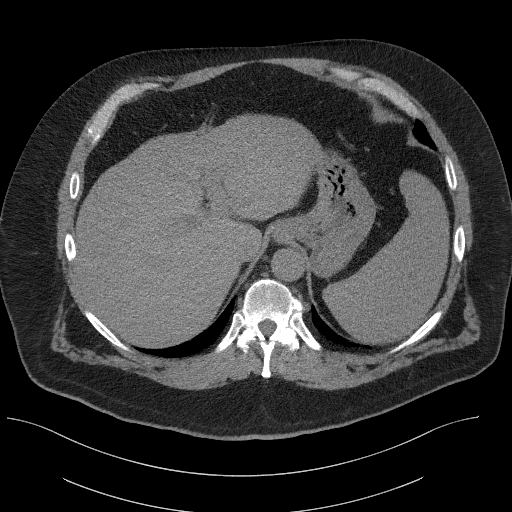
[im 77/128  lung]
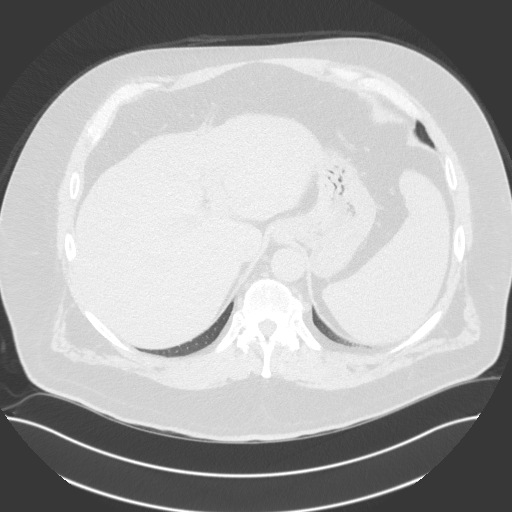
[im 102/128  soft-tissue]
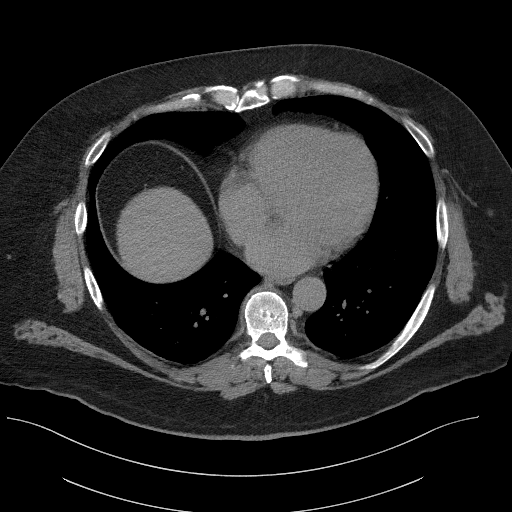
[im 102/128  lung]
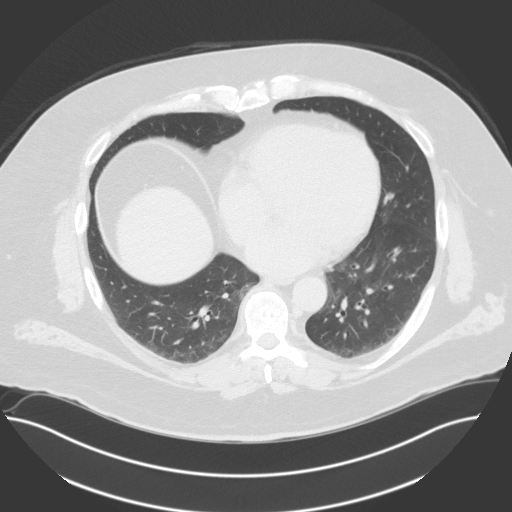

[Series 6: axial arterial · axial · arterial · 0.88mm/px · z∈[+1237,+1429]mm · 3 of 128 slices shown]
[im 32/128  soft-tissue]
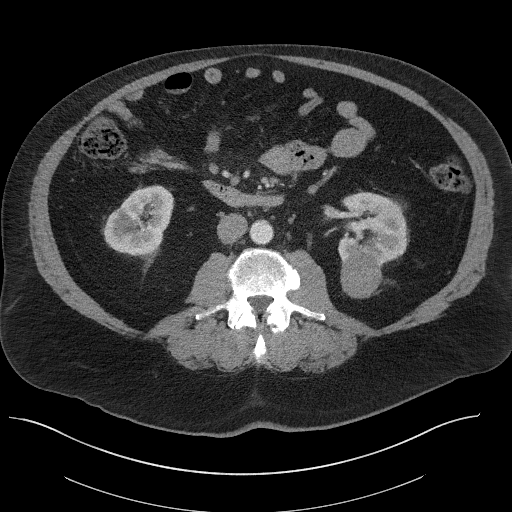
[im 64/128  soft-tissue]
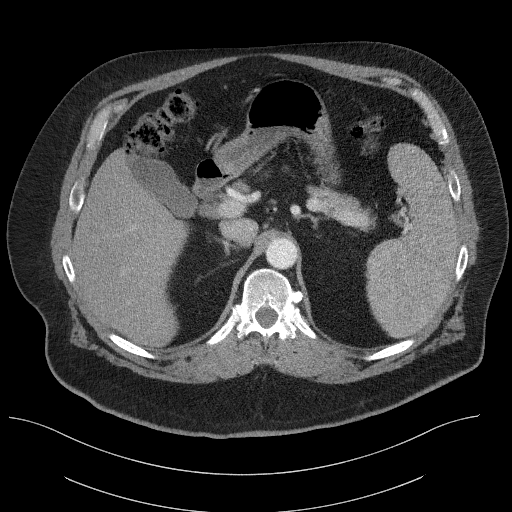
[im 96/128  soft-tissue]
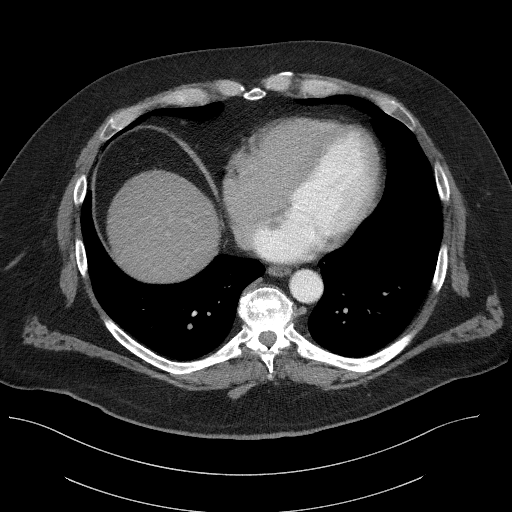

[Series 7: coronal arterial · coronal · arterial · 0.75mm/px · 2 of 120 slices shown, 3 images]
[im 40/120  soft-tissue]
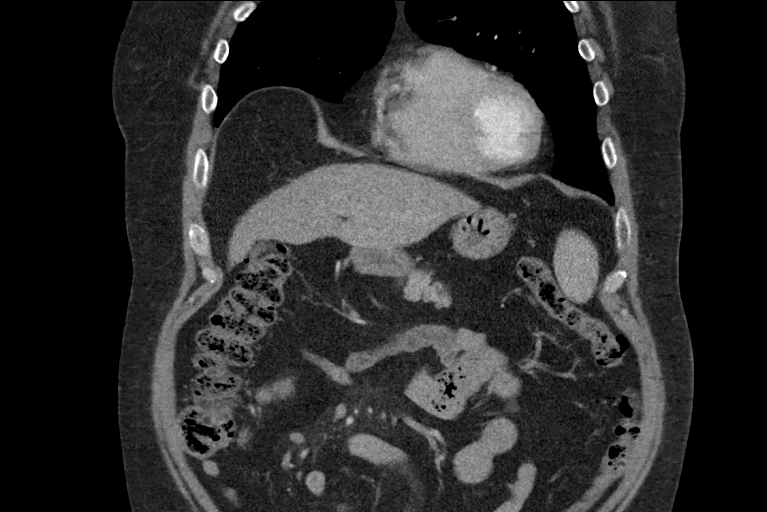
[im 40/120  bone]
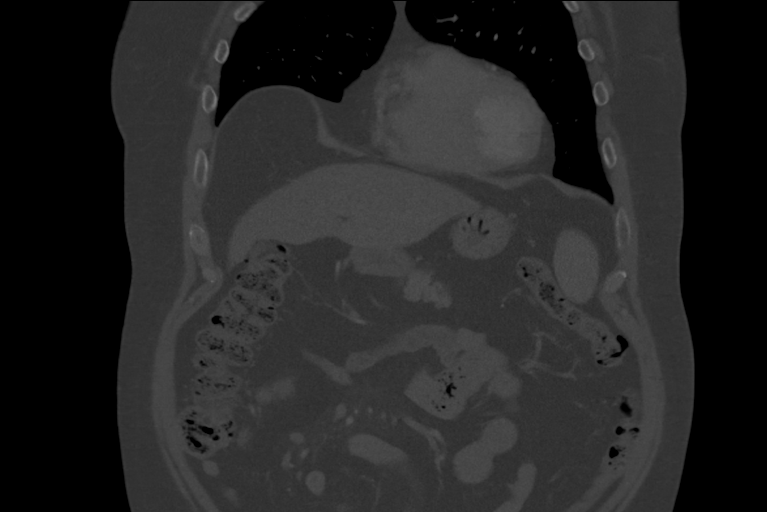
[im 80/120  soft-tissue]
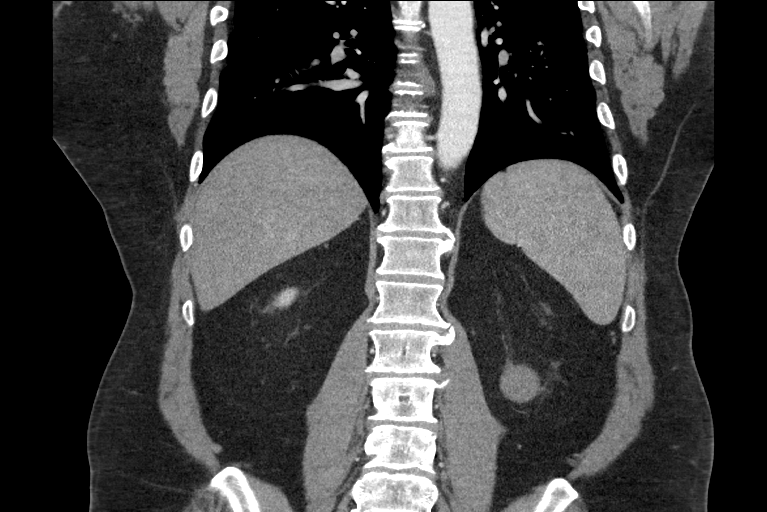

[9 of 46 positions shown; findings below may reference images not displayed]

FINDINGS: Lower chest: Unremarkable.

Hepatobiliary: Stable 2.3 cm low-density lesion in the inferior
right liver, likely a cyst. This was also present but smaller on the
study from 10/10/2009 suggesting benign etiology. There is no
evidence for gallstones, gallbladder wall thickening, or
pericholecystic fluid. No intrahepatic or extrahepatic biliary
dilation.

Pancreas: No focal mass lesion. No dilatation of the main duct. No
intraparenchymal cyst. No peripancreatic edema.

Spleen: No splenomegaly. No focal mass lesion.

Adrenals/Urinary Tract: No adrenal nodule or mass.

6.3 x 5.4 x 5.5 cm heterogeneously enhancing exophytic mass is
identified in the anterolateral lower interpolar left kidney. Lesion
extends into the central sinus fat (axial 88/6). No filling defect
in the left renal vein. 4.4 x 3.5 cm well-defined homogeneous
exophytic lesion from the posterior interpolar left kidney has
attenuation higher than simple fluid, but shows no enhancement after
IV contrast administration compatible with a complex cyst (Bosniak
II). 7 or 8 tiny 1-2 mm nonobstructing stones are seen in the left
kidney.

9 R10 tiny 1-3 mm stones are seen in the right kidney without
hydronephrosis. 3.8 cm exophytic simple cyst noted lower pole left
kidney. A second tiny exophytic lesion in the lower pole right
kidney is visible on image 101/series 6 measuring 14 mm. This is
also most likely a cyst. 9 mm low-density lesion lower pole right
kidney on 95/6 is too small to characterize. 2.4 cm lesion anterior
upper interpolar right kidney (82/6) approaches water density and
shows no enhancement, compatible with a cyst.

Stomach/Bowel: Stomach is unremarkable. No gastric wall thickening.
No evidence of outlet obstruction. Duodenum is normally positioned
as is the ligament of Treitz. No small bowel or colonic dilatation
within the visualized abdomen.

Vascular/Lymphatic: There are multiple ill-defined lymph nodes in
the ileocolic mesentery with associated soft tissue thickening.
These appear minimally progressive in the interval since 02/19/2020
but are smaller than 10/10/2009.

Other: No intraperitoneal free fluid.

Musculoskeletal: No worrisome lytic or sclerotic osseous
abnormality.
IMPRESSION: 1. 6.3 cm heterogeneously enhancing exophytic mass in the
anterolateral lower interpolar left kidney, consistent with renal
cell carcinoma. No evidence for renal vein involvement. No evidence
for metastatic disease in the abdomen.
2. Multiple ill-defined lymph nodes in the ileocolic mesentery with
associated soft tissue thickening. While these appear minimally more
prominent than the most recent comparison study, there are smaller
than an exam from [DATE] and potentially reflects sequelae of the
patient's CLL history. Given that there does appear to been some
minimal progression in the last 2 months, continued attention on
follow-up recommended.
3. Bilateral nonobstructing nephrolithiasis.
4. Bilateral renal cysts.
5. Stable 2.3 cm low-density lesion in the inferior right liver,
likely a cyst.

These results will be called to the ordering clinician or
representative by the Radiologist Assistant, and communication
documented in the PACS or [REDACTED].

## 2021-12-19 ENCOUNTER — Ambulatory Visit (HOSPITAL_COMMUNITY)
Admission: RE | Admit: 2021-12-19 | Discharge: 2021-12-19 | Disposition: A | Discharge status: Home / Self Care | Payer: Commercial Managed Care - PPO | Source: Ambulatory Visit | Attending: Urology | Admitting: Urology

## 2021-12-19 ENCOUNTER — Other Ambulatory Visit (HOSPITAL_COMMUNITY): Payer: Self-pay | Admitting: Urology

## 2021-12-19 DIAGNOSIS — C642 Malignant neoplasm of left kidney, except renal pelvis: Secondary | ICD-10-CM

## 2022-02-01 ENCOUNTER — Other Ambulatory Visit: Payer: Self-pay | Admitting: Student in an Organized Health Care Education/Training Program

## 2022-02-01 DIAGNOSIS — M4726 Other spondylosis with radiculopathy, lumbar region: Secondary | ICD-10-CM

## 2022-02-10 ENCOUNTER — Ambulatory Visit
Admission: RE | Admit: 2022-02-10 | Discharge: 2022-02-10 | Disposition: A | Discharge status: Home / Self Care | Payer: Commercial Managed Care - PPO | Source: Ambulatory Visit | Attending: Student in an Organized Health Care Education/Training Program | Admitting: Student in an Organized Health Care Education/Training Program

## 2022-02-10 DIAGNOSIS — M4726 Other spondylosis with radiculopathy, lumbar region: Secondary | ICD-10-CM

## 2022-03-27 LAB — HM COLONOSCOPY

## 2022-08-01 NOTE — Progress Notes (Signed)
[facility]  [address] [place],  [place]  [address] [phone]  Clinic Day:  08/02/2022  Referring physician: Emmaline Kluver, *   CHIEF COMPLAINT:  CC: Chronic lymphocytic leukemia  Current Treatment: Surveillance  HISTORY OF PRESENT ILLNESS:  Justin Cross is a 67 year old with chronic lymphocytic leukemia originally diagnosed in November 2002.  He had been on observation alone and never required therapy.  Over time, he had fairly stable disease with fluctuation of his total white count 20,000 and 40,000.  He has not had associated B symptoms, anemia or thrombocytopenia.   We had been seeing him annually and his last visit was in May 2021.   INTERVAL HISTORY:  Justin Cross is here today for repeat clinical assessment.  He missed an appointment in May 2022, but apparently was unaware of this.  Unfortunately, he has been lost to follow-up since May 2021.  He had a CBC in December 2021, which revealed white count of 20,500, but I do not have other records.  He denies fevers, chills or night sweats.  He denies any palpable adenopathy.  He denies abdominal pain.  He reports low back pain, which is being managed conservatively with physical therapy and medication.  MRI lumbar spine in August 2023 revealed mild degenerative disc and joint disease. His appetite is good, but he has been trying to lose weight due to the back pain. His weight has decreased 29 pounds over last nearly 3 years .  He states he underwent left nephrectomy for stage III kidney cancer in December 2021 with Dr. Tresa Moore.  He states he follows up with him yearly with CT imaging.  His most recent CT abdomen in June 2023 did not reveal any evidence of recurrence.  There were tiny ill-defined lymph nodes and stranding in the ileocolic mesentery possibly related collecting sequelae of his CLL, stable from previous.  Continued attention on follow-up was recommended.  REVIEW OF SYSTEMS:  Review of  Systems  Constitutional:  Negative for appetite change, chills, fatigue, fever and unexpected weight change.  HENT:   Negative for lump/mass, mouth sores and sore throat.   Respiratory:  Negative for cough and shortness of breath.   Cardiovascular:  Negative for chest pain and leg swelling.  Gastrointestinal:  Negative for abdominal pain, constipation, diarrhea, nausea and vomiting.  Genitourinary:  Negative for difficulty urinating, dysuria, frequency and hematuria.   Musculoskeletal:  Positive for back pain. Negative for arthralgias and myalgias.  Skin:  Negative for itching, rash and wound.  Neurological:  Negative for dizziness, extremity weakness, headaches, light-headedness and numbness.  Hematological:  Negative for adenopathy.  Psychiatric/Behavioral:  Negative for depression and sleep disturbance. The patient is not nervous/anxious.      VITALS:  Blood pressure 104/71, pulse 71, temperature 98.6 F (37 C), temperature source Oral, resp. rate 20, height 6' 2.5" (1.892 m), weight (!) 301 lb 6.4 oz (136.7 kg), SpO2 97 %.  Wt Readings from Last 3 Encounters:  08/02/22 (!) 301 lb 6.4 oz (136.7 kg)  04/29/21 (!) 324 lb (147 kg)  07/21/20 (!) 314 lb (142.4 kg)    Body mass index is 38.18 kg/m.  Performance status (ECOG): 1 - Symptomatic but completely ambulatory  PHYSICAL EXAM:  Physical Exam Vitals and nursing note reviewed.  Constitutional:      General: He is not in acute distress.    Appearance: Normal appearance. He is normal weight.  HENT:     Head: Normocephalic and atraumatic.  Mouth/Throat:     Mouth: Mucous membranes are moist.     Pharynx: Oropharynx is clear. No oropharyngeal exudate or posterior oropharyngeal erythema.  Eyes:     General: No scleral icterus.    Extraocular Movements: Extraocular movements intact.     Conjunctiva/sclera: Conjunctivae normal.     Pupils: Pupils are equal, round, and reactive to light.  Cardiovascular:     Rate and Rhythm:  Normal rate and regular rhythm.     Heart sounds: Normal heart sounds. No murmur heard.    No friction rub. No gallop.  Pulmonary:     Effort: Pulmonary effort is normal.     Breath sounds: Normal breath sounds. No wheezing, rhonchi or rales.  Abdominal:     General: Bowel sounds are normal. There is no distension.     Palpations: Abdomen is soft. There is no hepatomegaly, splenomegaly or mass.     Tenderness: There is no abdominal tenderness.  Musculoskeletal:        General: Normal range of motion.     Cervical back: Normal range of motion and neck supple. No tenderness.     Right lower leg: No edema.     Left lower leg: No edema.  Lymphadenopathy:     Cervical: No cervical adenopathy.     Upper Body:     Right upper body: No supraclavicular or axillary adenopathy.     Left upper body: No supraclavicular or axillary adenopathy.     Lower Body: No right inguinal adenopathy. No left inguinal adenopathy.  Skin:    General: Skin is warm and dry.     Coloration: Skin is not jaundiced.     Findings: No rash.  Neurological:     Mental Status: He is alert and oriented to person, place, and time.     Cranial Nerves: No cranial nerve deficit.  Psychiatric:        Mood and Affect: Mood normal.        Behavior: Behavior normal.        Thought Content: Thought content normal.    LABS:      Latest Ref Rng & Units 08/02/2022   12:00 AM 06/10/2020    5:04 AM 06/09/2020   11:43 AM  CBC  WBC  11.6        Hemoglobin 13.5 - 17.5 13.7     13.6  14.8   Hematocrit 41 - 53 40     40.6  44.3   Platelets 150 - 400 K/uL 131           This result is from an external source.      Latest Ref Rng & Units 07/22/2020    4:10 PM 06/10/2020    5:04 AM 06/02/2020    9:37 AM  CMP  Glucose 65 - 99 mg/dL 110  160  149   BUN 8 - 27 mg/dL 16  13  11   $ Creatinine 0.76 - 1.27 mg/dL 1.35  1.41  0.91   Sodium 134 - 144 mmol/L 132  134  137   Potassium 3.5 - 5.2 mmol/L 4.6  4.1  4.3   Chloride 96 - 106  mmol/L 96  103  105   CO2 20 - 29 mmol/L 23  27  24   $ Calcium 8.6 - 10.2 mg/dL 9.4  8.6  9.0      No results found for: "CEA1", "CEA" / No results found for: "CEA1", "CEA" No results found for: "PSA1" No results  found for: "CAN199" No results found for: "CAN125"  No results found for: "TOTALPROTELP", "ALBUMINELP", "A1GS", "A2GS", "BETS", "BETA2SER", "GAMS", "MSPIKE", "SPEI" Lab Results  Component Value Date   FERRITIN 74 12/30/2019   No results found for: "LDH"  STUDIES:  No results found.    HISTORY:   Past Medical History:  Diagnosis Date   Anginal pain ([place])    reflux   B12 deficiency    Cancer ([place])    Chest pain    Chronic bilateral low back pain with sciatica 12/30/2019   CLL (chronic lymphoblastic leukemia)    Constipation    Dyslipidemia    Dysrhythmia    "racing heart"   GERD (gastroesophageal reflux disease)    High blood pressure    Hyperlipidemia    Leg pain    Lumbosacral plexopathy    12/30/19 - estimates 18-20 years   Muscle spasm 12/30/2019   Obesity    OSA (obstructive sleep apnea)    Pre-diabetes    Reflux    Restless leg    Seasonal allergies    Hay fever   Sexual dysfunction    Sleep apnea    Ulcer    Urinary problem     Past Surgical History:  Procedure Laterality Date   [place]?   NOSE SURGERY  1977/78   Realignment   ROBOT ASSISTED LAPAROSCOPIC NEPHRECTOMY Left 06/09/2020   Procedure: XI ROBOTIC ASSISTED LAPAROSCOPIC RADICAL NEPHRECTOMY;  Surgeon: Alexis Frock, MD;  Location: [facility];  Service: Urology;  Laterality: Left;  3 HRS   SKIN SURGERY     Basal cell X3    Family History  Problem Relation Age of Onset   Heart disease Father    Hypertension Mother    Diabetes Mother    Diabetes Brother    Coronary artery disease Neg Hx     Social History:  reports that he quit smoking about 41 years ago. His smoking use included cigarettes. He has a 0.75 pack-year smoking history. He has never used smokeless  tobacco. He reports that he does not drink alcohol and does not use drugs.The patient is alone today.  Allergies:  Allergies  Allergen Reactions   Daypro [Oxaprozin] Rash    Current Medications: Current Outpatient Medications  Medication Sig Dispense Refill   azelastine (ASTELIN) 0.1 % nasal spray Place 1 spray into both nostrils 2 (two) times daily.     lansoprazole (PREVACID) 30 MG capsule SMARTSIG:1 Capsule(s) By Mouth Every Evening     acetaminophen (TYLENOL) 500 MG tablet Take 1,000 mg by mouth every 6 (six) hours as needed for mild pain or headache.     baclofen (LIORESAL) 10 MG tablet Take 5-10 mg by mouth 2 (two) times daily as needed.     clotrimazole-betamethasone (LOTRISONE) cream Apply topically 2 (two) times daily.     dexlansoprazole (DEXILANT) 60 MG capsule Take 60 mg by mouth daily.     gabapentin (NEURONTIN) 300 MG capsule Take 600 mg by mouth at bedtime.      simvastatin (ZOCOR) 40 MG tablet Take 40 mg by mouth at bedtime.     telmisartan-hydrochlorothiazide (MICARDIS HCT) 40-12.5 MG tablet Take 1 tablet by mouth daily.     traMADol (ULTRAM) 50 MG tablet Take 50 mg by mouth every 6 (six) hours as needed.     No current facility-administered medications for this visit.     ASSESSMENT & PLAN:   Assessment & Plan: 67 year old with chronic lymphocytic leukemia since 2002.  His disease remains indolent.  He does not have any B symptoms or palpable adenopathy.  He has had a decrease in his white blood cell count and percentage of lymphocytes.  He has mild anemia, which is new from  his last visit here, but stable from his last CBC.  Since his last visit, he underwent left nephrectomy for stage III renal cell carcinoma.  He remains under surveillance with Dr. Tresa Moore.  We will plan to see him back in 1 year with a CBC for continued observation.  The patient understands the plans discussed today and is in agreement with them.  He knows to contact our office if he develops concerns  prior to his next appointment.   I provided 15 minutes of face-to-face time during this encounter and > 50% was spent counseling as documented under my assessment and plan.    Marvia Pickles, PA-C  [facility] [address] [place] [address] Dept: [phone] Dept Fax: [phone]   Orders Placed This Encounter  Procedures   CBC w Diff ([facility] scanned report) STAT    Standing Status:   Future    Number of Occurrences:   1    Standing Expiration Date:   08/03/2023   CBC and differential    This external order was created through the Results Console.   CBC    This external order was created through the Results Console.

## 2022-08-02 ENCOUNTER — Inpatient Hospital Stay: Payer: Commercial Managed Care - PPO | Attending: Hematology and Oncology | Admitting: Hematology and Oncology

## 2022-08-02 ENCOUNTER — Inpatient Hospital Stay: Payer: Commercial Managed Care - PPO

## 2022-08-02 ENCOUNTER — Encounter: Payer: Self-pay | Admitting: Hematology and Oncology

## 2022-08-02 VITALS — BP 104/71 | HR 71 | Temp 98.6°F | Resp 20 | Ht 74.5 in | Wt 301.4 lb

## 2022-08-02 DIAGNOSIS — C9110 Chronic lymphocytic leukemia of B-cell type not having achieved remission: Secondary | ICD-10-CM

## 2022-08-02 LAB — CBC AND DIFFERENTIAL
HCT: 40 — AB (ref 41–53)
Hemoglobin: 13.7 (ref 13.5–17.5)
MCV: 87 (ref 80–94)
Neutrophils Absolute: 4.41
Platelets: 131 10*3/uL — AB (ref 150–400)
WBC: 11.6

## 2022-08-02 LAB — CBC: RBC: 4.64 (ref 3.87–5.11)

## 2022-08-03 ENCOUNTER — Encounter: Payer: Self-pay | Admitting: Hematology and Oncology

## 2022-08-23 ENCOUNTER — Telehealth: Payer: Self-pay | Admitting: Gastroenterology

## 2022-08-23 NOTE — Telephone Encounter (Signed)
Hi Dr. Lyndel Safe,   We received a referral for patient to be evaluated for severe reflux w/ history of chronic gastritis. The patient has GI history with Dr. Melina Copa and Is requesting a transfer of care. Records were obtained for you to review and advise on scheduling.    Thanks

## 2022-09-01 NOTE — Telephone Encounter (Signed)
LVM for patient to call back to schedule with an app

## 2022-09-01 NOTE — Telephone Encounter (Signed)
Patient has been scheduled for 3/11 at 8:30 with Vicie Mutters

## 2022-09-04 NOTE — Telephone Encounter (Signed)
Noted  

## 2022-09-07 NOTE — Progress Notes (Signed)
09/11/2022 Justin Cross [id number] [birth year]  Referring provider: Street, Sharon Mt, * Primary GI doctor: Dr. Lyndel Safe  ASSESSMENT AND PLAN:   Gastroesophageal reflux disease, unspecified whether esophagitis present with history of hiatal hernia Lifestyle changes discussed, avoid NSAIDS, ETOH Has intentionally lost weight, diet changes and is on dexilant twice daily with continuing symptoms of GERD/chest pressure, can have nocturnal symptoms. Mild dysphagia pills only Last EGD 2007, hiatal hernia, neg H pylori gastritis Will schedule EGD with possible dilatation for GERD despite PPI therapy, elevate hiatal hernia, evaluate H pylori/gastritis/esophagitis, etc Continue weight loss, can add on pepcid as needed.  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 male with a past medical history of HTN, hyperlipidemia, OSA, prediabetes, B12 deficiency, chronic lymphocytic leukemia diagnosed 2002, stage 3 kidney cancer s/p left nephrectomy 06/2020, chronic gastritis and others listed below presents for evaluation of chronic gastritis.   Previous patient of Dr. Melina Copa requesting transfer of care.  Notes reviewed 03/22/2006 endoscopy with Dr. Earlean Shawl for epigastric pain and melanic stools showed small hiatal hernia, gastritis predominantly antral moderate, duodenitis moderate.  Negative H. pylori 03/27/2022 colonoscopy at [facility] endoscopy center for screening purposes last one 12 years ago, GoLytely used with good prep completely normal colonoscopy recall 10 years.  Patient was on dexilant for years, well controlled until his  insurance stopped screening for it.  He does not have reflux into his throat but will have chest pressure, can wake him up at night.   Had cardiac evaluation in 2021 that was unremarkable, improved with going back on dexilant but has to take twice a day. He still has some chest pressure with eating and rare at night.  States he has lost 30 lbs intentionally with PT and to help with arthritis.  He has changed his eating habits, trying to avoid spicy foods, fatty foods.  Will not drink coffee, will have 1 diet soda in the AM and gum at night, both sweetened with aspartame.  Occ at night can have his pills get caught or move very slowly, will need to drink water. No issues with meats/bread.  He denies  melena, AB pain.  He denies nausea, vomiting.  He  denies AB bloating.  Gets scans yearly with Dr. Tinnie Gens at [facility].    He denies NSAID use.  He denies ETOH use.   He reports family history of gallbladder issues.   Will be [place] at Christmas.   04/13/2020 CT AB with and without contrast renal mass on MRI 1. 6.3 cm heterogeneously enhancing exophytic mass in the anterolateral lower interpolar left kidney, consistent with renal cell carcinoma. No evidence for renal vein involvement. No evidence for metastatic disease in the abdomen. 2. Multiple ill-defined lymph nodes in the ileocolic mesentery with associated soft tissue thickening. While these appear minimally more prominent than the most recent comparison study, there are smaller than an exam from 20/11 and potentially reflects sequelae of the patient's CLL history. Given that there does appear to been some minimal progression in the last 2 months, continued attention on follow-up recommended. 3. Bilateral nonobstructing nephrolithiasis. 4. Bilateral renal cysts. 5. Stable 2.3 cm low-density lesion  in the inferior right liver, likely a cyst.  He  reports that he quit smoking about 41 years ago. His smoking use included  cigarettes. He has a 0.75 pack-year smoking history. He has never used smokeless tobacco. He reports that he does not drink alcohol and does not use drugs.  RELEVANT LABS AND IMAGING: CBC    Component Value Date/Time   WBC 11.6 08/02/2022 0000   WBC 22.5 (H) 06/02/2020 0937   RBC 4.64 08/02/2022 0000   HGB 13.7 08/02/2022 0000   HGB 14.9 12/30/2019 1536   HCT 40 (A) 08/02/2022 0000   HCT 44.8 12/30/2019 1536   PLT 131 (A) 08/02/2022 0000   MCV 87 08/02/2022 0000   MCH 30.3 06/02/2020 0937   MCHC 33.9 06/02/2020 0937   RDW 13.0 06/02/2020 0937   RDW 13.0 12/30/2019 1536   LYMPHSABS 22.2 (H) 12/30/2019 1536   EOSABS 0.1 12/30/2019 1536   BASOSABS 0.1 12/30/2019 1536   Recent Labs    08/02/22 0000  HGB 13.7     CMP     Component Value Date/Time   NA 132 (L) 07/22/2020 1610   K 4.6 07/22/2020 1610   CL 96 07/22/2020 1610   CO2 23 07/22/2020 1610   GLUCOSE 110 (H) 07/22/2020 1610   GLUCOSE 160 (H) 06/10/2020 0504   BUN 16 07/22/2020 1610   CREATININE 1.35 (H) 07/22/2020 1610   CALCIUM 9.4 07/22/2020 1610   PROT 6.4 12/30/2019 1536   ALBUMIN 4.6 12/30/2019 1536   AST 20 12/30/2019 1536   ALT 22 12/30/2019 1536   ALKPHOS 48 12/30/2019 1536   BILITOT 0.5 12/30/2019 1536   GFRNONAA 56 (L) 07/22/2020 1610   GFRNONAA 56 (L) 06/10/2020 0504   GFRAA 65 07/22/2020 1610      Latest Ref Rng & Units 12/30/2019    3:36 PM  Hepatic Function  Total Protein 6.0 - 8.5 g/dL 6.4   Albumin 3.8 - 4.8 g/dL 4.6   AST 0 - 40 IU/L 20   ALT 0 - 44 IU/L 22   Alk Phosphatase 48 - 121 IU/L 48   Total Bilirubin 0.0 - 1.2 mg/dL 0.5       Current Medications:    Current Outpatient Medications (Cardiovascular):    simvastatin (ZOCOR) 40 MG tablet, Take 40 mg by mouth at bedtime.   telmisartan-hydrochlorothiazide (MICARDIS HCT) 40-12.5 MG tablet, Take 1 tablet by mouth daily.  Current Outpatient Medications (Respiratory):    azelastine (ASTELIN) 0.1 % nasal spray, Place 1 spray into  both nostrils 2 (two) times daily.  Current Outpatient Medications (Analgesics):    acetaminophen (TYLENOL) 500 MG tablet, Take 1,000 mg by mouth every 6 (six) hours as needed for mild pain or headache.   traMADol (ULTRAM) 50 MG tablet, Take 50 mg by mouth every 6 (six) hours as needed.   Current Outpatient Medications (Other):    Ascorbic Acid (VITA-C PO), Take 1 tablet by mouth daily.   B Complex Vitamins (VITAMIN B COMPLEX PO), Take 1 capsule by mouth daily.   baclofen (LIORESAL) 10 MG tablet, Take 5-10 mg by mouth 2 (two) times daily as needed.   Cholecalciferol (VITAMIN D-3 PO), Take 1 tablet by mouth daily.   clotrimazole-betamethasone (LOTRISONE) cream, Apply topically 2 (two) times daily.   dexlansoprazole (DEXILANT) 60 MG capsule, Take 60 mg by mouth 2 (two) times daily.   Flaxseed, Linseed, (FLAXSEED OIL PO), Take 1 capsule by mouth daily.   gabapentin (NEURONTIN) 300 MG capsule, Take 600  mg by mouth at bedtime.    Multiple Vitamins-Minerals (ZINC PO), Take 1 tablet by mouth daily.   Omega-3 Fatty Acids (FISH OIL PO), Take 1 capsule by mouth daily.   potassium chloride (MICRO-K) 10 MEQ CR capsule, Take 10 mEq by mouth daily.  Medical History:  Past Medical History:  Diagnosis Date   Anginal pain ([place])    reflux   Arthritis    B12 deficiency    Chest pain    Chronic bilateral low back pain with sciatica 12/30/2019   CLL (chronic lymphoblastic leukemia)    Constipation    Dyslipidemia    Dysrhythmia    "racing heart"   GERD (gastroesophageal reflux disease)    Hyperlipidemia    Hypertension    Leg pain    Lumbosacral plexopathy    12/30/19 - estimates 18-20 years   Muscle spasm 12/30/2019   Obesity    OSA (obstructive sleep apnea)    Pre-diabetes    Restless leg    Seasonal allergies    Hay fever   Sexual dysfunction    Spondylosis    lumbar   Ulcer    Urinary problem    Allergies:  Allergies  Allergen Reactions   Daypro [Oxaprozin] Rash     Surgical  History:  He  has a past surgical history that includes Nose surgery (1977/78); Anal fissure repair (1993?); Skin cancer excision; and Robot assisted laparoscopic nephrectomy (Left, 06/09/2020). Family History:  His family history includes Diabetes in his brother and mother; Heart disease in his father; Hypertension in his mother; Liver cancer in his brother; Prostate cancer in his brother and father.  REVIEW OF SYSTEMS  : All other systems reviewed and negative except where noted in the History of Present Illness.  PHYSICAL EXAM: BP 100/70 (BP Location: Left Arm, Patient Position: Sitting, Cuff Size: Large)   Pulse 64   Ht '6\' 1"'$  (1.854 m) Comment: height measured without shoes  Wt 295 lb (133.8 kg)   BMI 38.92 kg/m  General Appearance: Well nourished, in no apparent distress. Head:   Normocephalic and atraumatic. Eyes:  sclerae anicteric,conjunctive pink  Respiratory: Respiratory effort normal, BS equal bilaterally without rales, rhonchi, wheezing. Cardio: RRR with no MRGs. Peripheral pulses intact.  Abdomen: Soft,  Obese ,active bowel sounds. No tenderness . No masses. Rectal: Not evaluated Musculoskeletal: Full ROM, Normal gait. Without edema. Skin:  Dry and intact without significant lesions or rashes Neuro: Alert and  oriented x4;  No focal deficits. Psych:  Cooperative. Normal mood and affect.    Vladimir Crofts, PA-C 8:57 AM

## 2022-09-11 ENCOUNTER — Encounter: Payer: Self-pay | Admitting: Physician Assistant

## 2022-09-11 ENCOUNTER — Ambulatory Visit: Payer: Commercial Managed Care - PPO | Admitting: Physician Assistant

## 2022-09-11 VITALS — BP 100/70 | HR 64 | Ht 73.0 in | Wt 295.0 lb

## 2022-09-11 DIAGNOSIS — G4733 Obstructive sleep apnea (adult) (pediatric): Secondary | ICD-10-CM

## 2022-09-11 DIAGNOSIS — C8330 Diffuse large B-cell lymphoma, unspecified site: Secondary | ICD-10-CM

## 2022-09-11 DIAGNOSIS — Z85528 Personal history of other malignant neoplasm of kidney: Secondary | ICD-10-CM | POA: Diagnosis not present

## 2022-09-11 DIAGNOSIS — B2799 Infectious mononucleosis, unspecified with other complication: Secondary | ICD-10-CM

## 2022-09-11 DIAGNOSIS — K219 Gastro-esophageal reflux disease without esophagitis: Secondary | ICD-10-CM

## 2022-09-11 NOTE — Patient Instructions (Addendum)
Please take your proton pump inhibitor medication, continue dexilant for now  Please take this medication 30 minutes to 1 hour before meals- this makes it more effective.  CAN ADD PEPCID AS NEEDED Avoid spicy and acidic foods Avoid fatty foods Limit your intake of coffee, tea, alcohol, and carbonated drinks Work to maintain a healthy weight Keep the head of the bed elevated at least 3 inches with blocks or a wedge pillow if you are having any nighttime symptoms Stay upright for 2 hours after eating Avoid meals and snacks three to four hours before bedtime  If EGD negative will suggest [place]  You have been scheduled for an endoscopy. Please follow written instructions given to you at your visit today. If you use inhalers (even only as needed), please bring them with you on the day of your procedure.  _______________________________________________________  If your blood pressure at your visit was 140/90 or greater, please contact your primary care physician to follow up on this.  _______________________________________________________  If you are age 51 or older, your body mass index should be between 23-30. Your Body mass index is 38.92 kg/m. If this is out of the aforementioned range listed, please consider follow up with your Primary Care Provider.  If you are age 62 or younger, your body mass index should be between 19-25. Your Body mass index is 38.92 kg/m. If this is out of the aformentioned range listed, please consider follow up with your Primary Care Provider.   ________________________________________________________  The [facility] providers would like to encourage you to use [facility] to communicate with providers for non-urgent requests or questions.  Due to long hold times on the telephone, sending your provider a message by [facility] may be a faster and more efficient way to get a response.  Please allow 48 business hours for a response.  Please remember that this is for  non-urgent requests.  _______________________________________________________ It was a pleasure to see you today!  Thank you for trusting me with your gastrointestinal care!

## 2022-09-24 NOTE — Progress Notes (Signed)
Agree with assessment/plan.  Raj Akiyah Eppolito, MD [facility] [phone]  

## 2022-10-26 ENCOUNTER — Ambulatory Visit (AMBULATORY_SURGERY_CENTER): Payer: Commercial Managed Care - PPO | Admitting: Gastroenterology

## 2022-10-26 ENCOUNTER — Ambulatory Visit: Payer: Commercial Managed Care - PPO | Attending: Cardiology | Admitting: Cardiology

## 2022-10-26 ENCOUNTER — Encounter: Payer: Self-pay | Admitting: Gastroenterology

## 2022-10-26 ENCOUNTER — Encounter: Payer: Self-pay | Admitting: Cardiology

## 2022-10-26 VITALS — BP 108/67 | HR 61 | Temp 97.3°F | Resp 13 | Ht 73.0 in | Wt 295.0 lb

## 2022-10-26 VITALS — BP 128/72 | HR 67 | Ht 74.0 in | Wt 298.0 lb

## 2022-10-26 DIAGNOSIS — G4733 Obstructive sleep apnea (adult) (pediatric): Secondary | ICD-10-CM

## 2022-10-26 DIAGNOSIS — K219 Gastro-esophageal reflux disease without esophagitis: Secondary | ICD-10-CM

## 2022-10-26 DIAGNOSIS — R0609 Other forms of dyspnea: Secondary | ICD-10-CM

## 2022-10-26 DIAGNOSIS — I10 Essential (primary) hypertension: Secondary | ICD-10-CM

## 2022-10-26 DIAGNOSIS — K209 Esophagitis, unspecified without bleeding: Secondary | ICD-10-CM

## 2022-10-26 DIAGNOSIS — E785 Hyperlipidemia, unspecified: Secondary | ICD-10-CM | POA: Diagnosis not present

## 2022-10-26 DIAGNOSIS — R9431 Abnormal electrocardiogram [ECG] [EKG]: Secondary | ICD-10-CM

## 2022-10-26 DIAGNOSIS — K2950 Unspecified chronic gastritis without bleeding: Secondary | ICD-10-CM

## 2022-10-26 DIAGNOSIS — R0600 Dyspnea, unspecified: Secondary | ICD-10-CM

## 2022-10-26 HISTORY — DX: Abnormal electrocardiogram (ECG) (EKG): R94.31

## 2022-10-26 MED ORDER — SODIUM CHLORIDE 0.9 % IV SOLN
500.0000 mL | Freq: Once | INTRAVENOUS | Status: DC
Start: 2022-10-26 — End: 2022-10-26

## 2022-10-26 MED ORDER — RABEPRAZOLE SODIUM 20 MG PO TBEC: 20.0000 mg | DELAYED_RELEASE_TABLET | Freq: Every day | ORAL | 6 refills | Status: DC

## 2022-10-26 MED ORDER — FAMOTIDINE 40 MG PO TABS: 40.0000 mg | ORAL_TABLET | Freq: Every day | ORAL | 6 refills | Status: DC

## 2022-10-26 NOTE — Op Note (Signed)
[facility] Patient Name: Justin Cross Procedure Date: 10/26/2022 9:36 AM MRN: [id number] Endoscopist: Lynann Bologna , MD, [id number] Age: 67 Referring MD:  Date of Birth: August 06, 1955 Gender: Male Account #: [id number] Procedure:                Upper GI endoscopy Indications:              Epigastric abdominal pain with refractory reflux.                            No dysphagia. Medicines:                Monitored Anesthesia Care Procedure:                Pre-Anesthesia Assessment:                           - Prior to the procedure, a History and Physical                            was performed, and patient medications and                            allergies were reviewed. The patient's tolerance of                            previous anesthesia was also reviewed. The risks                            and benefits of the procedure and the sedation                            options and risks were discussed with the patient.                            All questions were answered, and informed consent                            was obtained. Prior Anticoagulants: The patient has                            taken no anticoagulant or antiplatelet agents. ASA                            Grade Assessment: II - A patient with mild systemic                            disease. After reviewing the risks and benefits,                            the patient was deemed in satisfactory condition to                            undergo the procedure.  After obtaining informed consent, the endoscope was                            passed under direct vision. Throughout the                            procedure, the patient's blood pressure, pulse, and                            oxygen saturations were monitored continuously. The                            Olympus Scope [id number] was introduced through the                            mouth, and advanced to the second part of  duodenum.                            The upper GI endoscopy was accomplished without                            difficulty. The patient tolerated the procedure                            well. Scope In: Scope Out: Findings:                 The examined esophagus was normal. Biopsies were                            obtained from the proximal and distal esophagus                            with cold forceps for histology of suspected                            eosinophilic esophagitis.                           The Z-line was regular and was found 42 cm from the                            incisors.                           Small 1 cm hiatal hernia was noted. The entire                            examined stomach was normal except for mild antral                            gastritis. Biopsies were taken with a cold forceps                            for histology.  The examined duodenum was normal. Biopsies for                            histology were taken with a cold forceps for                            evaluation of celiac disease. Complications:            No immediate complications. Estimated Blood Loss:     Estimated blood loss: none. Impression:               - Small hiatal hernia                           - Mild gastritis. Recommendation:           - Patient has a contact number available for                            emergencies. The signs and symptoms of potential                            delayed complications were discussed with the                            patient. Return to normal activities tomorrow.                            Written discharge instructions were provided to the                            patient.                           - Resume previous diet.                           - Change Dexilant to Aciphex 20 mg p.o. every                            morning #90, Pepcid 40 mg p.o. nightly                           - [place] abdo                            - Await pathology results.                           - FU with Marchelle Folks in 6 to 8 weeks                           - Nonpharmacologic means of reflux control.                           - The findings and recommendations were discussed  with the patient's family. Lynann Bologna, MD 10/26/2022 9:59:23 AM This report has been signed electronically.

## 2022-10-26 NOTE — Addendum Note (Signed)
Addended by: Baldo Ash D on: 10/26/2022 01:13 PM   Modules accepted: Orders

## 2022-10-26 NOTE — Progress Notes (Signed)
Called to room to assist during endoscopic procedure.  Patient ID and intended procedure confirmed with present staff. Received instructions for my participation in the procedure from the performing physician.  

## 2022-10-26 NOTE — Progress Notes (Signed)
Cardiology Office Note:    Date:  10/26/2022   ID:  [name], [name] 06-26-1956, MRN [id number]  PCP:  Street, Stephanie Coup, MD  Cardiologist:  Gypsy Balsam, MD    Referring MD: Street, Stephanie Coup, *   Chief Complaint  Patient presents with   Follow-up    History of Present Illness:    Justin Cross is a 67 y.o. male past medical history significant for essential hypertension, status post nephrectomy secondary to renal cancer, morbid obesity, hyperlipidemia, subsequent apnea, palpitations comes today to my office after not being seen for most 2 years.  Overall doing quite well.  Describing of some fatigue tiredness but at the same time exercise on the regular basis.  He did have some back problem 1-2 physical therapy enjoyed tremendously finished he is very happy and still doing the same exercises.  No chest pain tightness squeezing pressure chest no swelling of lower extremities today actually he got upper GI done.  Past Medical History:  Diagnosis Date   Anginal pain    reflux   Arthritis    B12 deficiency    Chest pain    Chronic bilateral low back pain with sciatica 12/30/2019   CLL (chronic lymphoblastic leukemia)    Constipation    Dyslipidemia    Dysrhythmia    "racing heart"   GERD (gastroesophageal reflux disease)    Hyperlipidemia    Hypertension    Leg pain    Lumbosacral plexopathy    12/30/19 - estimates 18-20 years   Muscle spasm 12/30/2019   Obesity    OSA (obstructive sleep apnea)    Pre-diabetes    Restless leg    Seasonal allergies    Hay fever   Sexual dysfunction    Sleep apnea    Spondylosis    lumbar   Ulcer    Urinary problem     Past Surgical History:  Procedure Laterality Date   ANAL FISSURE REPAIR  1993?   NOSE SURGERY  1977/78   Realignment   ROBOT ASSISTED LAPAROSCOPIC NEPHRECTOMY Left 06/09/2020   Procedure: XI ROBOTIC ASSISTED LAPAROSCOPIC RADICAL NEPHRECTOMY;  Surgeon: Sebastian Ache, MD;  Location: [facility];   Service: Urology;  Laterality: Left;  3 HRS   SKIN CANCER EXCISION     Basal cell X3    Current Medications: Current Meds  Medication Sig   acetaminophen (TYLENOL) 500 MG tablet Take 1,000 mg by mouth every 6 (six) hours as needed for mild pain or headache.   Ascorbic Acid (VITA-C PO) Take 1 tablet by mouth daily.   azelastine (ASTELIN) 0.1 % nasal spray Place 1 spray into both nostrils 2 (two) times daily.   B Complex Vitamins (VITAMIN B COMPLEX PO) Take 1 capsule by mouth daily.   baclofen (LIORESAL) 10 MG tablet Take 5-10 mg by mouth 2 (two) times daily as needed for muscle spasms.   Cholecalciferol (VITAMIN D-3 PO) Take 1 tablet by mouth daily.   clotrimazole-betamethasone (LOTRISONE) cream Apply 1 Application topically 2 (two) times daily.   dexlansoprazole (DEXILANT) 60 MG capsule Take 60 mg by mouth 2 (two) times daily.   famotidine (PEPCID) 40 MG tablet Take 1 tablet (40 mg total) by mouth at bedtime.   Flaxseed, Linseed, (FLAXSEED OIL PO) Take 1 capsule by mouth daily.   gabapentin (NEURONTIN) 300 MG capsule Take 600 mg by mouth at bedtime.    meclizine (ANTIVERT) 25 MG tablet Take 25 mg by mouth every 8 (eight) hours as needed for  dizziness or nausea.   Omega-3 Fatty Acids (FISH OIL PO) Take 1 capsule by mouth daily.   potassium chloride (MICRO-K) 10 MEQ CR capsule Take 10 mEq by mouth daily.   RABEprazole (ACIPHEX) 20 MG tablet Take 1 tablet (20 mg total) by mouth daily.   simvastatin (ZOCOR) 40 MG tablet Take 40 mg by mouth at bedtime.   telmisartan-hydrochlorothiazide (MICARDIS HCT) 40-12.5 MG tablet Take 1 tablet by mouth daily.   traMADol (ULTRAM) 50 MG tablet Take 50 mg by mouth every 6 (six) hours as needed.   [DISCONTINUED] Multiple Vitamins-Minerals (ZINC PO) Take 1 tablet by mouth daily.     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   Left-handed.   Caffeine: 1/3 of a 2-liter bottle of Diet Mountain Dew per day.   Social Determinants of Health   Financial Resource Strain: Not on file  Food Insecurity: Not on file  Transportation Needs: Not on file  Physical Activity: Not on file  Stress: Not on file  Social Connections: Not on file     Family History: The patient's family history includes Diabetes in his brother and mother; Heart disease in his father; Hypertension in his mother; Liver cancer in his brother; Prostate cancer in his brother and father. There is no history of Coronary artery disease, Colon cancer, Stomach cancer, Esophageal cancer, or Pancreatic cancer. ROS:   Please see the history of present illness.    All 14 point review of systems negative except as described per history of present illness  EKGs/Labs/Other Studies Reviewed:      Recent Labs: 08/02/2022: Hemoglobin 13.7; Platelets 131  Recent Lipid Panel No results found for: "CHOL", "TRIG", "HDL", "CHOLHDL", "VLDL", "LDLCALC", "LDLDIRECT"  Physical Exam:    VS:  BP 128/72 (BP Location: Left Arm, Patient Position: Sitting)   Pulse 67   Ht  (1.88 m)   Wt 298 lb (135.2 kg)   SpO2 99%   BMI 38.26 kg/m     Wt Readings from Last 3 Encounters:  10/26/22 298 lb (135.2 kg)  10/26/22 295 lb (133.8  kg)  09/11/22 295 lb (133.8 kg)     GEN:  Well nourished, well developed in no acute distress HEENT: Normal NECK: No JVD; No carotid bruits LYMPHATICS: No lymphadenopathy CARDIAC: RRR, no murmurs, no rubs, no gallops RESPIRATORY:  Clear to auscultation without rales, wheezing or rhonchi  ABDOMEN: Soft, non-tender, non-distended MUSCULOSKELETAL:  No edema; No deformity  SKIN: Warm and dry LOWER EXTREMITIES: no swelling NEUROLOGIC:  Alert and oriented x 3 PSYCHIATRIC:  Normal affect   ASSESSMENT:    1. Nonspecific abnormal electrocardiogram (ECG) (EKG)   2. Primary hypertension   3. OSA (obstructive sleep apnea)   4. Dyslipidemia    PLAN:    In order of problems listed above:  Nonspecific abnormality of the EKG.  Raising suspicion for anterior wall myocardial infarction with poor R wave progression anterior precordium, will do echocardiogram to verify that. Essential hypertension blood pressure well-controlled. Obstructive sleep apnea to be followed by primary care physician.  He lost significant amount of weight intentionally since I seen him last time which make him feel better. Dyslipidemia I did review K PN he is total cholesterol 117 HDL 27 however this is from November 2022 will make arrangements for fasting lipid profile to be done   Medication Adjustments/Labs and Tests Ordered: Current medicines are reviewed at length with the patient today.  Concerns regarding medicines are outlined above.  No orders of the defined types were placed in this encounter.  Medication changes: No orders of the defined types were placed in this encounter.   Signed, Georgeanna Lea, MD, [facility] 10/26/2022 1:05 PM    [facility]

## 2022-10-26 NOTE — Progress Notes (Signed)
09/11/2022 Justin Cross [id number] 1956-01-10   Referring provider: Street, Stephanie Coup, * Primary GI doctor: Dr. Chales Abrahams   ASSESSMENT AND PLAN:    Gastroesophageal reflux disease, unspecified whether esophagitis present with history of hiatal hernia Lifestyle changes discussed, avoid NSAIDS, ETOH Has intentionally lost weight, diet changes and is on dexilant twice daily with continuing symptoms of GERD/chest pressure, can have nocturnal symptoms. Mild dysphagia pills only Last EGD 2007, hiatal hernia, neg H pylori gastritis Will schedule EGD with possible dilatation for GERD despite PPI therapy, elevate hiatal hernia, evaluate H pylori/gastritis/esophagitis, etc Continue weight loss, can add on pepcid as needed.  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 male with a past medical history of HTN, hyperlipidemia, OSA, prediabetes, B12 deficiency, chronic lymphocytic leukemia diagnosed 2002, stage 3 kidney cancer s/p left nephrectomy 06/2020, chronic gastritis and others listed below presents for evaluation of chronic gastritis.    Previous patient of Dr. Charm Barges requesting transfer of care.  Notes reviewed 03/22/2006 endoscopy with Dr. Kinnie Scales for epigastric pain and melanic stools showed small hiatal hernia, gastritis predominantly antral moderate, duodenitis moderate.  Negative H. pylori 03/27/2022 colonoscopy at [facility] for screening purposes last one 12 years ago, GoLytely used with good prep completely normal colonoscopy recall 10 years.   Patient was on dexilant for years, well controlled  until his insurance stopped screening for it.  He does not have reflux into his throat but will have chest pressure, can wake him up at night.   Had cardiac evaluation in 2021 that was unremarkable, improved with going back on dexilant but has to take twice a day. He still has some chest pressure with eating and rare at night.  States he has lost 30 lbs intentionally with PT and to help with arthritis.  He has changed his eating habits, trying to avoid spicy foods, fatty foods.  Will not drink coffee, will have 1 diet soda in the AM and gum at night, both sweetened with aspartame.  Occ at night can have his pills get caught or move very slowly, will need to drink water. No issues with meats/bread.  He denies  melena, AB pain.  He denies nausea, vomiting.  He  denies AB bloating.  Gets scans yearly with Dr. Hughes Better at [facility] urology.    He denies NSAID use.  He denies ETOH use.   He reports family history of gallbladder issues.    [place] be [place] at [place].    04/13/2020 CT AB with and without contrast renal mass on MRI 1. 6.3 cm heterogeneously enhancing exophytic mass in the anterolateral lower interpolar left kidney, consistent with renal cell carcinoma. No evidence for renal vein involvement. No evidence for metastatic disease in the abdomen. 2. Multiple ill-defined lymph nodes in the ileocolic mesentery with associated soft tissue thickening. While these appear minimally more prominent than the most recent comparison study, there are smaller than an exam from 20/11 and potentially reflects sequelae of the patient's CLL history. Given that there does appear to been some minimal progression in the last 2 months, continued attention on follow-up recommended. 3. Bilateral  nonobstructing nephrolithiasis. 4. Bilateral renal cysts. 5. Stable 2.3 cm low-density lesion in the inferior right liver, likely a cyst.   He  reports that he quit smoking about 41 years ago. His smoking use  included cigarettes. He has a 0.75 pack-year smoking history. He has never used smokeless tobacco. He reports that he does not drink alcohol and does not use drugs.   RELEVANT LABS AND IMAGING: CBC Labs (Brief)          Component Value Date/Time    WBC 11.6 08/02/2022 0000    WBC 22.5 (H) 06/02/2020 0937    RBC 4.64 08/02/2022 0000    HGB 13.7 08/02/2022 0000    HGB 14.9 12/30/2019 1536    HCT 40 (A) 08/02/2022 0000    HCT 44.8 12/30/2019 1536    PLT 131 (A) 08/02/2022 0000    MCV 87 08/02/2022 0000    MCH 30.3 06/02/2020 0937    MCHC 33.9 06/02/2020 0937    RDW 13.0 06/02/2020 0937    RDW 13.0 12/30/2019 1536    LYMPHSABS 22.2 (H) 12/30/2019 1536    EOSABS 0.1 12/30/2019 1536    BASOSABS 0.1 12/30/2019 1536      Recent Labs (within last 365 days)     Recent Labs    08/02/22 0000  HGB 13.7          CMP     Labs (Brief)          Component Value Date/Time    NA 132 (L) 07/22/2020 1610    K 4.6 07/22/2020 1610    CL 96 07/22/2020 1610    CO2 23 07/22/2020 1610    GLUCOSE 110 (H) 07/22/2020 1610    GLUCOSE 160 (H) 06/10/2020 0504    BUN 16 07/22/2020 1610    CREATININE 1.35 (H) 07/22/2020 1610    CALCIUM 9.4 07/22/2020 1610    PROT 6.4 12/30/2019 1536    ALBUMIN 4.6 12/30/2019 1536    AST 20 12/30/2019 1536    ALT 22 12/30/2019 1536    ALKPHOS 48 12/30/2019 1536    BILITOT 0.5 12/30/2019 1536    GFRNONAA 56 (L) 07/22/2020 1610    GFRNONAA 56 (L) 06/10/2020 0504    GFRAA 65 07/22/2020 1610          Latest Ref Rng & Units 12/30/2019    3:36 PM  Hepatic Function  Total Protein 6.0 - 8.5 g/dL 6.4   Albumin 3.8 - 4.8 g/dL 4.6   AST 0 - 40 IU/L 20   ALT 0 - 44 IU/L 22   Alk Phosphatase 48 - 121 IU/L 48   Total Bilirubin 0.0 - 1.2 mg/dL 0.5       Current Medications:      Current Outpatient Medications (Cardiovascular):    simvastatin (ZOCOR) 40 MG tablet, Take 40 mg by mouth at bedtime.   telmisartan-hydrochlorothiazide (MICARDIS HCT) 40-12.5 MG  tablet, Take 1 tablet by mouth daily.   Current Outpatient Medications (Respiratory):    azelastine (ASTELIN) 0.1 % nasal spray, Place 1 spray into both nostrils 2 (two) times daily.   Current Outpatient Medications (Analgesics):    acetaminophen (TYLENOL) 500 MG tablet, Take 1,000 mg by mouth every 6 (six) hours as needed for mild pain or headache.   traMADol (ULTRAM) 50 MG tablet, Take 50 mg by mouth every 6 (six) hours as needed.     Current Outpatient Medications (Other):    Ascorbic Acid (VITA-C PO), Take 1 tablet by mouth  daily.   B Complex Vitamins (VITAMIN B COMPLEX PO), Take 1 capsule by mouth daily.   baclofen (LIORESAL) 10 MG tablet, Take 5-10 mg by mouth 2 (two) times daily as needed.   Cholecalciferol (VITAMIN D-3 PO), Take 1 tablet by mouth daily.   clotrimazole-betamethasone (LOTRISONE) cream, Apply topically 2 (two) times daily.   dexlansoprazole (DEXILANT) 60 MG capsule, Take 60 mg by mouth 2 (two) times daily.   Flaxseed, Linseed, (FLAXSEED OIL PO), Take 1 capsule by mouth daily.   gabapentin (NEURONTIN) 300 MG capsule, Take 600 mg by mouth at bedtime.    Multiple Vitamins-Minerals (ZINC PO), Take 1 tablet by mouth daily.   Omega-3 Fatty Acids (FISH OIL PO), Take 1 capsule by mouth daily.   potassium chloride (MICRO-K) 10 MEQ CR capsule, Take 10 mEq by mouth daily.   Medical History:      Past Medical History:  Diagnosis Date   Anginal pain (HCC)      reflux   Arthritis     B12 deficiency     Chest pain     Chronic bilateral low back pain with sciatica 12/30/2019   CLL (chronic lymphoblastic leukemia)     Constipation     Dyslipidemia     Dysrhythmia      "racing heart"   GERD (gastroesophageal reflux disease)     Hyperlipidemia     Hypertension     Leg pain     Lumbosacral plexopathy      12/30/19 - estimates 18-20 years   Muscle spasm 12/30/2019   Obesity     OSA (obstructive sleep apnea)     Pre-diabetes     Restless leg     Seasonal allergies       Hay fever   Sexual dysfunction     Spondylosis      lumbar   Ulcer     Urinary problem      Allergies:      Allergies  Allergen Reactions   Daypro [Oxaprozin] Rash      Surgical History:  He  has a past surgical history that includes Nose surgery (1977/78); Anal fissure repair (1993?); Skin cancer excision; and Robot assisted laparoscopic nephrectomy (Left, 06/09/2020). Family History:  His family history includes Diabetes in his brother and mother; Heart disease in his father; Hypertension in his mother; Liver cancer in his brother; Prostate cancer in his brother and father.   REVIEW OF SYSTEMS  : All other systems reviewed and negative except where noted in the History of Present Illness.   PHYSICAL EXAM: BP 100/70 (BP Location: Left Arm, Patient Position: Sitting, Cuff Size: Large)   Pulse 64   Ht  (1.854 m) Comment: height measured without shoes  Wt 295 lb (133.8 kg)   BMI 38.92 kg/m  General Appearance: Well nourished, in no apparent distress. Head:   Normocephalic and atraumatic. Eyes:  sclerae anicteric,conjunctive pink  Respiratory: Respiratory effort normal, BS equal bilaterally without rales, rhonchi, wheezing. Cardio: RRR with no MRGs. Peripheral pulses intact.  Abdomen: Soft,  Obese ,active bowel sounds. No tenderness . No masses. Rectal: Not evaluated Musculoskeletal: Full ROM, Normal gait. Without edema. Skin:  Dry and intact without significant lesions or rashes Neuro: Alert and  oriented x4;  No focal deficits. Psych:  Cooperative. Normal mood and affect.      Doree Albee, PA-C 8:57 AM   Attending physician's note   I have taken history, reviewed the chart and examined the patient. I performed  a substantive portion of this encounter, including complete performance of at least one of the key components, in conjunction with the APP. I agree with the Advanced Practitioner's note, impression and recommendations.    Edman Circle, MD [facility]  GI [phone]

## 2022-10-26 NOTE — Patient Instructions (Addendum)
Await pathology results  Resume previous diet and continue present medications - stop taking Dexilant, change to Aciphex 20 mg every morning and Pepcid 40 mg nightly (pick up from [facility]) Ultrasound of abdomen to be scheduled  Follow-up with Marchelle Folks in 6-8 weeks - first available appointment 01/03/23 at 9am (if this appointment doesn't work for you and you need a different date or time please call the office to reschedule) Follow non-pharmacologic means of reflux control    YOU HAD AN ENDOSCOPIC PROCEDURE TODAY AT THE [facility]:   Refer to the procedure report that was given to you for any specific questions about what was found during the examination.  If the procedure report does not answer your questions, please call your gastroenterologist to clarify.  If you requested that your care partner not be given the details of your procedure findings, then the procedure report has been included in a sealed envelope for you to review at your convenience later.  YOU SHOULD EXPECT: Some feelings of bloating in the abdomen. Passage of more gas than usual.  Walking can help get rid of the air that was put into your GI tract during the procedure and reduce the bloating. If you had a lower endoscopy (such as a colonoscopy or flexible sigmoidoscopy) you may notice spotting of blood in your stool or on the toilet paper. If you underwent a bowel prep for your procedure, you may not have a normal bowel movement for a few days.  Please Note:  You might notice some irritation and congestion in your nose or some drainage.  This is from the oxygen used during your procedure.  There is no need for concern and it should clear up in a day or so.  SYMPTOMS TO REPORT IMMEDIATELY:   Following upper endoscopy (EGD)  Vomiting of blood or coffee ground material  New chest pain or pain under the shoulder blades  Painful or persistently difficult swallowing  New shortness of breath  Fever of 100F or  higher  Black, tarry-looking stools  For urgent or emergent issues, a gastroenterologist can be reached at any hour by calling [phone]. Do not use MyChart messaging for urgent concerns.    DIET:  We do recommend a small meal at first, but then you may proceed to your regular diet.  Drink plenty of fluids but you should avoid alcoholic beverages for 24 hours.  ACTIVITY:  You should plan to take it easy for the rest of today and you should NOT DRIVE or use heavy machinery until tomorrow (because of the sedation medicines used during the test).    FOLLOW UP: Our staff will call the number listed on your records the next business day following your procedure.  We will call around 7:15- 8:00 am to check on you and address any questions or concerns that you may have regarding the information given to you following your procedure. If we do not reach you, we will leave a message.     If any biopsies were taken you will be contacted by phone or by letter within the next 1-3 weeks.  Please call us at [phone] if you have not heard about the biopsies in 3 weeks.    SIGNATURES/CONFIDENTIALITY: You and/or your care partner have signed paperwork which will be entered into your electronic medical record.  These signatures attest to the fact that that the information above on your After Visit Summary has been reviewed and is understood.  Full  responsibility of the confidentiality of this discharge information lies with you and/or your care-partner.

## 2022-10-26 NOTE — Patient Instructions (Addendum)

## 2022-10-26 NOTE — Progress Notes (Signed)
Report to PACU, RN, vss, BBS= Clear.  

## 2022-10-27 ENCOUNTER — Telehealth: Payer: Self-pay | Admitting: *Deleted

## 2022-10-27 NOTE — Telephone Encounter (Signed)
  Follow up Call-     10/26/2022    9:19 AM  Call back number  Post procedure Call Back phone  # [phone]  Permission to leave phone message Yes     Patient questions:  Do you have a fever, pain , or abdominal swelling? No. Pain Score  0 *  Have you tolerated food without any problems? Yes.    Have you been able to return to your normal activities? Yes.    Do you have any questions about your discharge instructions: Diet   No. Medications  No. Follow up visit  No.  Do you have questions or concerns about your Care? No.  Actions: * If pain score is 4 or above: No action needed, pain <4.

## 2022-11-02 ENCOUNTER — Encounter: Payer: Self-pay | Admitting: Gastroenterology

## 2022-11-14 ENCOUNTER — Ambulatory Visit: Payer: Commercial Managed Care - PPO | Attending: Cardiology

## 2022-11-14 DIAGNOSIS — R0609 Other forms of dyspnea: Secondary | ICD-10-CM

## 2022-11-14 DIAGNOSIS — R0600 Dyspnea, unspecified: Secondary | ICD-10-CM

## 2022-11-15 LAB — LIPID PANEL
Chol/HDL Ratio: 4.2 ratio (ref 0.0–5.0)
Cholesterol, Total: 148 mg/dL (ref 100–199)
HDL: 35 mg/dL — ABNORMAL LOW (ref >39)
LDL Chol Calc (NIH): 95 mg/dL (ref 0–99)
Triglycerides: 94 mg/dL (ref 0–149)
VLDL Cholesterol Cal: 18 mg/dL (ref 5–40)

## 2022-11-15 LAB — ECHOCARDIOGRAM COMPLETE
Area-P 1/2: 3.32 cm2
Calc EF: 49.6 %
S' Lateral: 4.2 cm
Single Plane A2C EF: 50.4 %
Single Plane A4C EF: 49 %

## 2022-11-15 LAB — AST: AST: 13 IU/L (ref 0–40)

## 2022-11-15 LAB — ALT: ALT: 17 IU/L (ref 0–44)

## 2022-11-22 ENCOUNTER — Telehealth: Payer: Self-pay

## 2022-11-22 DIAGNOSIS — E785 Hyperlipidemia, unspecified: Secondary | ICD-10-CM

## 2022-11-22 MED ORDER — ATORVASTATIN CALCIUM 20 MG PO TABS: 20.0000 mg | ORAL_TABLET | Freq: Every day | ORAL | 0 refills | Status: DC

## 2022-11-22 NOTE — Telephone Encounter (Signed)
-----   Message from Georgeanna Lea, MD sent at 11/20/2022 11:54 AM EDT ----- Cholesterol still a little on the higher side.  Simple maneuver we can do is switch simvastatin to Lipitor 20 mg daily, fasting lipid profile AST LT need to be repeated 6 weeks

## 2022-11-22 NOTE — Telephone Encounter (Signed)
Patient notified of results and recommendations and agreed with plan, medication sent, lab order on file.   

## 2022-12-01 ENCOUNTER — Ambulatory Visit (HOSPITAL_COMMUNITY)
Admission: RE | Admit: 2022-12-01 | Discharge: 2022-12-01 | Disposition: A | Discharge status: Home / Self Care | Payer: Commercial Managed Care - PPO | Source: Ambulatory Visit | Attending: Urology | Admitting: Urology

## 2022-12-01 ENCOUNTER — Other Ambulatory Visit (HOSPITAL_COMMUNITY): Payer: Self-pay | Admitting: Urology

## 2022-12-01 DIAGNOSIS — Z125 Encounter for screening for malignant neoplasm of prostate: Secondary | ICD-10-CM

## 2022-12-01 DIAGNOSIS — C642 Malignant neoplasm of left kidney, except renal pelvis: Secondary | ICD-10-CM

## 2022-12-01 DIAGNOSIS — N200 Calculus of kidney: Secondary | ICD-10-CM | POA: Insufficient documentation

## 2022-12-01 DIAGNOSIS — Z905 Acquired absence of kidney: Secondary | ICD-10-CM

## 2022-12-21 NOTE — Progress Notes (Signed)
12/21/2022 Justin Cross [id number] 22-Nov-1955  Referring provider: Casper Harrison, Stephanie Coup, * Primary GI doctor: Dr. Chales Abrahams  ASSESSMENT AND PLAN:   GERD with esophagitis EGD with Dr. Chales Abrahams 10/26/2022 showed small hiatal hernia, mild gastritis, esophagitis negative H. pylori negative EOE negative celiac Switch patient to Aciphex 20 mg and Pepcid 40 mg with improvement of his symptoms. He is also cut back on his soda and has continued weight loss. Will follow-up as needed.  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 male with a past medical history of HTN, hyperlipidemia, OSA, prediabetes, B12 deficiency, chronic lymphocytic leukemia diagnosed 2002, stage 3 kidney cancer s/p left nephrectomy 06/2020, chronic gastritis and others listed below presents for evaluation of chronic gastritis.   Previous patient of Dr. Charm Barges. 03/22/2006 endoscopy with Dr. Kinnie Scales for epigastric pain and melanic stools showed small hiatal hernia, gastritis predominantly antral moderate, duodenitis moderate.  Negative H. pylori 03/27/2022 colonoscopy at [facility] endoscopy center for screening purposes last one 12 years ago, GoLytely used with good prep completely normal colonoscopy recall 10 years. 09/11/2022 office visit with myself as a new patient for worsening reflux with history of hiatal hernia, set up for EGD for reflux despite Dexilant 10/26/2022 EGD with Dr. Chales Abrahams showed small hiatal hernia, mild gastritis, negative H pylori 11/14/2022 echocardiogram with cardiology for dyspnea showed EF 50 to 55%, no valve abnormalities  He has cut his diet soda in half and trying to drink more water.  This is helping.  He is also  on aciphex 20 mg and pepcid 40 mg.  Takes 30-60 mins before food.  He has not had any more nocturnal GERD since having this done.  No trouble swallowing.  He has head of the bed elevated.  He denies  melena, AB pain.  He denies nausea, vomiting.  He  denies AB bloating.  Gets scans yearly with Dr. Hughes Better at [facility] urology.    He denies NSAID use.  He denies ETOH use.   He reports family history of gallbladder issues.   [place] at [place].   04/13/2020 CT AB with and without contrast renal mass on MRI 1. 6.3 cm heterogeneously enhancing exophytic mass in the anterolateral lower interpolar left kidney, consistent with renal cell carcinoma. No evidence for renal vein involvement. No evidence for metastatic disease in the abdomen. 2. Multiple ill-defined lymph nodes in the ileocolic mesentery with associated soft tissue thickening. While these appear minimally more prominent than the most recent comparison study, there are smaller than an exam from 20/11 and potentially reflects sequelae of the patient's CLL history. Given that there does appear to been some minimal progression in the last 2 months, continued attention on follow-up recommended. 3. Bilateral nonobstructing nephrolithiasis. 4. Bilateral renal cysts. 5. Stable 2.3 cm low-density lesion in the inferior right liver, likely a cyst.  He  reports that he quit smoking about 41 years ago. His smoking use included cigarettes. He has a 0.75 pack-year smoking history. He has never used smokeless tobacco. He reports that he does not drink alcohol and does not use drugs.  RELEVANT LABS AND IMAGING: CBC    Component Value Date/Time   WBC 11.6 08/02/2022 0000   WBC 22.5 (H) 06/02/2020 8295  RBC 4.64 08/02/2022 0000   HGB 13.7 08/02/2022 0000   HGB 14.9 12/30/2019 1536   HCT 40 (A) 08/02/2022 0000   HCT 44.8 12/30/2019 1536   PLT 131 (A) 08/02/2022 0000   MCV 87 08/02/2022 0000   MCH 30.3 06/02/2020 0937   MCHC  33.9 06/02/2020 0937   RDW 13.0 06/02/2020 0937   RDW 13.0 12/30/2019 1536   LYMPHSABS 22.2 (H) 12/30/2019 1536   EOSABS 0.1 12/30/2019 1536   BASOSABS 0.1 12/30/2019 1536   Recent Labs    08/02/22 0000  HGB 13.7     CMP     Component Value Date/Time   NA 132 (L) 07/22/2020 1610   K 4.6 07/22/2020 1610   CL 96 07/22/2020 1610   CO2 23 07/22/2020 1610   GLUCOSE 110 (H) 07/22/2020 1610   GLUCOSE 160 (H) 06/10/2020 0504   BUN 16 07/22/2020 1610   CREATININE 1.35 (H) 07/22/2020 1610   CALCIUM 9.4 07/22/2020 1610   PROT 6.4 12/30/2019 1536   ALBUMIN 4.6 12/30/2019 1536   AST 13 11/14/2022 0901   ALT 17 11/14/2022 0901   ALKPHOS 48 12/30/2019 1536   BILITOT 0.5 12/30/2019 1536   GFRNONAA 56 (L) 07/22/2020 1610   GFRNONAA 56 (L) 06/10/2020 0504   GFRAA 65 07/22/2020 1610      Latest Ref Rng & Units 11/14/2022    9:01 AM 12/30/2019    3:36 PM  Hepatic Function  Total Protein 6.0 - 8.5 g/dL  6.4   Albumin 3.8 - 4.8 g/dL  4.6   AST 0 - 40 IU/L 13  20   ALT 0 - 44 IU/L 17  22   Alk Phosphatase 48 - 121 IU/L  48   Total Bilirubin 0.0 - 1.2 mg/dL  0.5       Current Medications:    Current Outpatient Medications (Cardiovascular):    atorvastatin (LIPITOR) 20 MG tablet, Take 1 tablet (20 mg total) by mouth daily.   telmisartan-hydrochlorothiazide (MICARDIS HCT) 40-12.5 MG tablet, Take 1 tablet by mouth daily.  Current Outpatient Medications (Respiratory):    azelastine (ASTELIN) 0.1 % nasal spray, Place 1 spray into both nostrils 2 (two) times daily.  Current Outpatient Medications (Analgesics):    acetaminophen (TYLENOL) 500 MG tablet, Take 1,000 mg by mouth every 6 (six) hours as needed for mild pain or headache.   traMADol (ULTRAM) 50 MG tablet, Take 50 mg by mouth every 6 (six) hours as needed.   Current Outpatient Medications (Other):    Ascorbic Acid (VITA-C PO), Take 1 tablet by mouth daily.   B Complex Vitamins (VITAMIN B COMPLEX PO), Take 1 capsule by mouth  daily.   baclofen (LIORESAL) 10 MG tablet, Take 5-10 mg by mouth 2 (two) times daily as needed for muscle spasms.   Cholecalciferol (VITAMIN D-3 PO), Take 1 tablet by mouth daily.   clotrimazole-betamethasone (LOTRISONE) cream, Apply 1 Application topically 2 (two) times daily.   dexlansoprazole (DEXILANT) 60 MG capsule, Take 60 mg by mouth 2 (two) times daily.   famotidine (PEPCID) 40 MG tablet, Take 1 tablet (40 mg total) by mouth at bedtime.   Flaxseed, Linseed, (FLAXSEED OIL PO), Take 1 capsule by mouth daily.   gabapentin (NEURONTIN) 300 MG capsule, Take 600 mg by mouth at bedtime.    meclizine (ANTIVERT) 25 MG tablet, Take 25 mg by mouth every 8 (eight) hours as needed for dizziness or nausea.   Omega-3 Fatty Acids (FISH OIL PO), Take 1 capsule by  mouth daily.   potassium chloride (MICRO-K) 10 MEQ CR capsule, Take 10 mEq by mouth daily.   RABEprazole (ACIPHEX) 20 MG tablet, Take 1 tablet (20 mg total) by mouth daily.  Medical History:  Past Medical History:  Diagnosis Date   Anginal pain (HCC)    reflux   Arthritis    B12 deficiency    Chest pain    Chronic bilateral low back pain with sciatica 12/30/2019   CLL (chronic lymphoblastic leukemia)    Constipation    Dyslipidemia    Dysrhythmia    "racing heart"   GERD (gastroesophageal reflux disease)    Hyperlipidemia    Hypertension    Leg pain    Lumbosacral plexopathy    12/30/19 - estimates 18-20 years   Muscle spasm 12/30/2019   Obesity    OSA (obstructive sleep apnea)    Pre-diabetes    Restless leg    Seasonal allergies    Hay fever   Sexual dysfunction    Sleep apnea    Spondylosis    lumbar   Ulcer    Urinary problem    Allergies:  Allergies  Allergen Reactions   Daypro [Oxaprozin] Rash     Surgical History:  He  has a past surgical history that includes Nose surgery (1977/78); Anal fissure repair (1993?); Skin cancer excision; and Robot assisted laparoscopic nephrectomy (Left, 06/09/2020). Family  History:  His family history includes Diabetes in his brother and mother; Heart disease in his father; Hypertension in his mother; Liver cancer in his brother; Prostate cancer in his brother and father.  REVIEW OF SYSTEMS  : All other systems reviewed and negative except where noted in the History of Present Illness.  PHYSICAL EXAM: There were no vitals taken for this visit. General Appearance: Well nourished, in no apparent distress. Head:   Normocephalic and atraumatic. Eyes:  sclerae anicteric,conjunctive pink  Respiratory: Respiratory effort normal, BS equal bilaterally without rales, rhonchi, wheezing. Cardio: RRR with no MRGs. Peripheral pulses intact.  Abdomen: Soft,  Obese ,active bowel sounds. No tenderness . No masses. Rectal: Not evaluated Musculoskeletal: Full ROM, Normal gait. Without edema. Skin:  Dry and intact without significant lesions or rashes Neuro: Alert and  oriented x4;  No focal deficits. Psych:  Cooperative. Normal mood and affect.    Doree Albee, PA-C 8:31 AM

## 2023-01-03 ENCOUNTER — Ambulatory Visit: Payer: Commercial Managed Care - PPO | Admitting: Physician Assistant

## 2023-01-03 ENCOUNTER — Encounter: Payer: Self-pay | Admitting: Physician Assistant

## 2023-01-03 VITALS — BP 134/66 | HR 79 | Ht 74.0 in | Wt 302.0 lb

## 2023-01-03 DIAGNOSIS — Z85528 Personal history of other malignant neoplasm of kidney: Secondary | ICD-10-CM | POA: Diagnosis not present

## 2023-01-03 DIAGNOSIS — G4733 Obstructive sleep apnea (adult) (pediatric): Secondary | ICD-10-CM

## 2023-01-03 DIAGNOSIS — B2799 Infectious mononucleosis, unspecified with other complication: Secondary | ICD-10-CM

## 2023-01-03 DIAGNOSIS — C8330 Diffuse large B-cell lymphoma, unspecified site: Secondary | ICD-10-CM

## 2023-01-03 DIAGNOSIS — K219 Gastro-esophageal reflux disease without esophagitis: Secondary | ICD-10-CM

## 2023-01-03 NOTE — Patient Instructions (Addendum)
Please take your proton pump inhibitor medication, aciphex   Please take this medication 30 minutes to 1 hour before meals- this makes it more effective.  Avoid spicy and acidic foods Avoid fatty foods Limit your intake of coffee, tea, alcohol, and carbonated drinks Work to maintain a healthy weight Keep the head of the bed elevated at least 3 inches with blocks or a wedge pillow if you are having any nighttime symptoms Stay upright for 2 hours after eating Avoid meals and snacks three to four hours before bedtime  Follow up as needed.  _______________________________________________________  If your blood pressure at your visit was 140/90 or greater, please contact your primary care physician to follow up on this.  _______________________________________________________  If you are age 12 or older, your body mass index should be between 23-30. Your Body mass index is 38.77 kg/m. If this is out of the aforementioned range listed, please consider follow up with your Primary Care Provider.  If you are age 64 or younger, your body mass index should be between 19-25. Your Body mass index is 38.77 kg/m. If this is out of the aformentioned range listed, please consider follow up with your Primary Care Provider.   ________________________________________________________  The [facility] providers would like to encourage you to use [facility] to communicate with providers for non-urgent requests or questions.  Due to long hold times on the telephone, sending your provider a message by [facility] may be a faster and more efficient way to get a response.  Please allow 48 business hours for a response.  Please remember that this is for non-urgent requests.  _______________________________________________________ It was a pleasure to see you today!  Thank you for trusting me with your gastrointestinal care!

## 2023-01-25 NOTE — Progress Notes (Signed)
Agree with assessment/plan.  Raj Gupta, MD [facility] [phone]  

## 2023-02-26 ENCOUNTER — Other Ambulatory Visit: Payer: Self-pay | Admitting: Cardiology

## 2023-05-21 ENCOUNTER — Other Ambulatory Visit: Payer: Self-pay | Admitting: Cardiology

## 2023-08-02 ENCOUNTER — Other Ambulatory Visit: Payer: Self-pay | Admitting: Hematology and Oncology

## 2023-08-02 DIAGNOSIS — C9110 Chronic lymphocytic leukemia of B-cell type not having achieved remission: Secondary | ICD-10-CM

## 2023-08-03 ENCOUNTER — Inpatient Hospital Stay: Payer: Commercial Managed Care - PPO

## 2023-08-03 ENCOUNTER — Inpatient Hospital Stay: Payer: Commercial Managed Care - PPO | Admitting: Hematology and Oncology

## 2023-08-08 ENCOUNTER — Encounter: Payer: Self-pay | Admitting: Hematology and Oncology

## 2023-08-08 ENCOUNTER — Inpatient Hospital Stay: Payer: Commercial Managed Care - PPO | Admitting: Hematology and Oncology

## 2023-08-08 ENCOUNTER — Inpatient Hospital Stay: Payer: Commercial Managed Care - PPO | Attending: Hematology and Oncology

## 2023-08-08 VITALS — BP 139/85 | HR 65 | Temp 98.3°F | Resp 20 | Ht 74.0 in | Wt 315.9 lb

## 2023-08-08 DIAGNOSIS — D649 Anemia, unspecified: Secondary | ICD-10-CM | POA: Insufficient documentation

## 2023-08-08 DIAGNOSIS — Z87891 Personal history of nicotine dependence: Secondary | ICD-10-CM | POA: Diagnosis not present

## 2023-08-08 DIAGNOSIS — C9110 Chronic lymphocytic leukemia of B-cell type not having achieved remission: Secondary | ICD-10-CM | POA: Insufficient documentation

## 2023-08-08 DIAGNOSIS — Z79899 Other long term (current) drug therapy: Secondary | ICD-10-CM | POA: Diagnosis not present

## 2023-08-08 LAB — CBC WITH DIFFERENTIAL (CANCER CENTER ONLY)
Abs Immature Granulocytes: 0 10*3/uL (ref 0.00–0.07)
Basophils Absolute: 0.1 10*3/uL (ref 0.0–0.1)
Basophils Relative: 1 %
Eosinophils Absolute: 0.1 10*3/uL (ref 0.0–0.5)
Eosinophils Relative: 1 %
HCT: 40.3 % (ref 39.0–52.0)
Hemoglobin: 14.2 g/dL (ref 13.0–17.0)
Immature Granulocytes: 0 %
Lymphocytes Relative: 38 %
Lymphs Abs: 4.1 10*3/uL — ABNORMAL HIGH (ref 0.7–4.0)
MCH: 30.4 pg (ref 26.0–34.0)
MCHC: 35.2 g/dL (ref 30.0–36.0)
MCV: 86.3 fL (ref 80.0–100.0)
Monocytes Absolute: 0.6 10*3/uL (ref 0.1–1.0)
Monocytes Relative: 6 %
Neutro Abs: 5.9 10*3/uL (ref 1.7–7.7)
Neutrophils Relative %: 54 %
Platelet Count: 132 10*3/uL — ABNORMAL LOW (ref 150–400)
RBC: 4.67 MIL/uL (ref 4.22–5.81)
RDW: 13.1 % (ref 11.5–15.5)
WBC Count: 10.8 10*3/uL — ABNORMAL HIGH (ref 4.0–10.5)
nRBC: 0 % (ref 0.0–0.2)
nRBC: 0 /100{WBCs}

## 2023-08-08 NOTE — Progress Notes (Signed)
 [facility]  [address] [place],  [place]  [address] [phone]  Clinic Day:  08/08/2023  Referring physician: Rusty Lonni HERO, *   CHIEF COMPLAINT:  CC: Chronic lymphocytic leukemia  Current Treatment:  Surveillance  HISTORY OF PRESENT ILLNESS:  Justin Cross is a 68 y.o. male with a history of chronic lymphocytic leukemia diagnosed in November 2002.  He had been on observation alone and never required therapy.  He has had fairly stable disease. He was lost to follow-up from May 2021 until January 2024. He underwent left nephrectomy in December 2021 for a stage III renal cell carcinoma. He is followed by Dr. Alvaro.  When I saw him him January 2024, his WBCs were down to 11,660. WBC's were 21,900 in May 2021.  INTERVAL HISTORY:  Justin Cross is here today for repeat clinical assessment. He reports frequent muscle spasms of his legs up to his thighs. He is seeing Dr. Rusty later this week regarding this.  He denies fatigue. He denies abnormal bleeding or bruising.  He denies fevers, chills or night sweats. He denies pain. His appetite is good. His weight has increased 13 pounds over last year . He is up to date on screening colonoscopy.  REVIEW OF SYSTEMS:  Review of Systems  Constitutional:  Negative for appetite change, chills, diaphoresis, fatigue, fever and unexpected weight change.  HENT:   Negative for lump/mass, mouth sores and sore throat.   Respiratory:  Negative for cough and shortness of breath.   Cardiovascular:  Negative for chest pain and leg swelling.  Gastrointestinal:  Negative for abdominal pain, blood in stool, constipation, diarrhea, nausea and vomiting.  Genitourinary:  Negative for difficulty urinating, dysuria, frequency and hematuria.   Musculoskeletal:  Positive for myalgias (muscle spasms). Negative for arthralgias, back pain and neck pain.  Skin:  Negative for itching, rash and wound.  Neurological:  Negative for dizziness,  extremity weakness, headaches, light-headedness and numbness.  Hematological:  Negative for adenopathy. Does not bruise/bleed easily.  Psychiatric/Behavioral:  Negative for depression and sleep disturbance. The patient is not nervous/anxious.      VITALS:  Blood pressure 139/85, pulse 65, temperature 98.3 F (36.8 C), temperature source Oral, resp. rate 20, height 6' 2 (1.88 m), weight (!) 315 lb 14.4 oz (143.3 kg), SpO2 97%.  Wt Readings from Last 3 Encounters:  08/08/23 (!) 315 lb 14.4 oz (143.3 kg)  01/03/23 (!) 302 lb (137 kg)  10/26/22 298 lb (135.2 kg)    Body mass index is 40.56 kg/m.  Performance status (ECOG): 0 - Asymptomatic  PHYSICAL EXAM:  Physical Exam Vitals and nursing note reviewed.  Constitutional:      General: He is not in acute distress.    Appearance: Normal appearance. He is obese. He is not ill-appearing, toxic-appearing or diaphoretic.  HENT:     Head: Normocephalic and atraumatic.     Mouth/Throat:     Mouth: Mucous membranes are moist.     Pharynx: Oropharynx is clear. No oropharyngeal exudate or posterior oropharyngeal erythema.  Eyes:     General: No scleral icterus.    Extraocular Movements: Extraocular movements intact.     Conjunctiva/sclera: Conjunctivae normal.     Pupils: Pupils are equal, round, and reactive to light.  Cardiovascular:     Rate and Rhythm: Normal rate and regular rhythm.     Heart sounds: Normal heart sounds. No murmur heard.    No friction rub. No gallop.  Pulmonary:  Effort: Pulmonary effort is normal.     Breath sounds: Normal breath sounds. No wheezing, rhonchi or rales.  Abdominal:     General: Bowel sounds are normal. There is no distension.     Palpations: Abdomen is soft. There is no mass.     Tenderness: There is no abdominal tenderness.     Comments: It is difficult to assess for hepatosplenomegaly due to body habitus.  Musculoskeletal:        General: Normal range of motion.     Cervical back: Normal  range of motion and neck supple. No tenderness.     Right lower leg: No edema.     Left lower leg: No edema.  Lymphadenopathy:     Cervical: No cervical adenopathy.     Upper Body:     Right upper body: No supraclavicular or axillary adenopathy.     Left upper body: No supraclavicular or axillary adenopathy.  Skin:    General: Skin is warm and dry.     Coloration: Skin is not jaundiced.     Findings: No rash.  Neurological:     Mental Status: He is alert and oriented to person, place, and time.     Cranial Nerves: No cranial nerve deficit.  Psychiatric:        Mood and Affect: Mood normal.        Behavior: Behavior normal.        Thought Content: Thought content normal.     LABS:      Latest Ref Rng & Units 08/08/2023    3:39 PM 08/02/2022   12:00 AM 06/10/2020    5:04 AM  CBC  WBC 4.0 - 10.5 K/uL 10.8  11.6       Hemoglobin 13.0 - 17.0 g/dL 85.7  86.2     86.3   Hematocrit 39.0 - 52.0 % 40.3  40     40.6   Platelets 150 - 400 K/uL 132  131          This result is from an external source.      Latest Ref Rng & Units 11/14/2022    9:01 AM 07/22/2020    4:10 PM 06/10/2020    5:04 AM  CMP  Glucose 65 - 99 mg/dL  889  839   BUN 8 - 27 mg/dL  16  13   Creatinine 9.23 - 1.27 mg/dL  8.64  8.58   Sodium 865 - 144 mmol/L  132  134   Potassium 3.5 - 5.2 mmol/L  4.6  4.1   Chloride 96 - 106 mmol/L  96  103   CO2 20 - 29 mmol/L  23  27   Calcium  8.6 - 10.2 mg/dL  9.4  8.6   AST 0 - 40 IU/L 13     ALT 0 - 44 IU/L 17         Lab Results  Component Value Date   FERRITIN 74 12/30/2019     STUDIES:  No results found.    HISTORY:   Past Medical History:  Diagnosis Date   Anginal pain (HCC)    reflux   Arthritis    B12 deficiency    Chest pain    Chronic bilateral low back pain with sciatica 12/30/2019   CLL (chronic lymphoblastic leukemia)    Constipation    Dyslipidemia    Dysrhythmia    racing heart   GERD (gastroesophageal reflux disease)     Hyperlipidemia    Hypertension  Leg pain    Lumbosacral plexopathy    12/30/19 - estimates 18-20 years   Muscle spasm 12/30/2019   Obesity    OSA (obstructive sleep apnea)    Pre-diabetes    Restless leg    Seasonal allergies    Hay fever   Sexual dysfunction    Sleep apnea    Spondylosis    lumbar   Ulcer    Urinary problem     Past Surgical History:  Procedure Laterality Date   ANAL FISSURE REPAIR  1993?   NOSE SURGERY  1977/78   Realignment   ROBOT ASSISTED LAPAROSCOPIC NEPHRECTOMY Left 06/09/2020   Procedure: XI ROBOTIC ASSISTED LAPAROSCOPIC RADICAL NEPHRECTOMY;  Surgeon: Alvaro Hummer, MD;  Location: [facility];  Service: Urology;  Laterality: Left;  3 HRS   SKIN CANCER EXCISION     Basal cell X3    Family History  Problem Relation Age of Onset   Hypertension Mother    Diabetes Mother    Heart disease Father    Prostate cancer Father    Diabetes Brother    Prostate cancer Brother    Liver cancer Brother    Coronary artery disease Neg Hx    Colon cancer Neg Hx    Stomach cancer Neg Hx    Esophageal cancer Neg Hx    Pancreatic cancer Neg Hx     Social History:  reports that he quit smoking about 42 years ago. His smoking use included cigarettes. He started smoking about 45 years ago. He has a 0.8 pack-year smoking history. He has never used smokeless tobacco. He reports that he does not drink alcohol and does not use drugs.The patient is alone today.  Allergies:  Allergies  Allergen Reactions   Daypro [Oxaprozin] Rash    Current Medications: Current Outpatient Medications  Medication Sig Dispense Refill   acetaminophen  (TYLENOL ) 500 MG tablet Take 1,000 mg by mouth every 6 (six) hours as needed for mild pain or headache.     Ascorbic Acid (VITA-C PO) Take 1 tablet by mouth daily.     atorvastatin  (LIPITOR) 20 MG tablet Take 1 tablet (20 mg total) by mouth daily. 90 tablet 0   azelastine (ASTELIN) 0.1 % nasal spray Place 1 spray into both nostrils 2 (two)  times daily.     B Complex Vitamins (VITAMIN B COMPLEX PO) Take 1 capsule by mouth daily. (Patient not taking: Reported on 08/08/2023)     baclofen (LIORESAL) 10 MG tablet Take 5-10 mg by mouth 2 (two) times daily as needed for muscle spasms.     Cholecalciferol (VITAMIN D-3 PO) Take 1 tablet by mouth daily. (Patient not taking: Reported on 08/08/2023)     clotrimazole-betamethasone (LOTRISONE) cream Apply 1 Application topically 2 (two) times daily.     famotidine  (PEPCID ) 40 MG tablet Take 1 tablet (40 mg total) by mouth at bedtime. 90 tablet 6   Flaxseed, Linseed, (FLAXSEED OIL PO) Take 1 capsule by mouth daily.     gabapentin  (NEURONTIN ) 300 MG capsule Take 600 mg by mouth at bedtime.      meclizine (ANTIVERT) 25 MG tablet Take 25 mg by mouth every 8 (eight) hours as needed for dizziness or nausea. (Patient not taking: Reported on 08/08/2023)     Omega-3 Fatty Acids (FISH OIL PO) Take 1 capsule by mouth daily.     potassium chloride  (MICRO-K ) 10 MEQ CR capsule Take 10 mEq by mouth daily.     RABEprazole  (ACIPHEX ) 20 MG tablet Take 1  tablet (20 mg total) by mouth daily. 90 tablet 6   telmisartan-hydrochlorothiazide (MICARDIS HCT) 40-12.5 MG tablet Take 1 tablet by mouth daily.     traMADol  (ULTRAM ) 50 MG tablet Take 50 mg by mouth every 6 (six) hours as needed.     No current facility-administered medications for this visit.     ASSESSMENT & PLAN:   Assessment & Plan: Justin Cross is a 68 y.o. male with chronic lymphocytic anemia. WBC's are lowest in years. There is still mild thrombocytopenia.  We will plan to see him back in 1 year with a CBC for continued surveillance.  The patient understands the plans discussed today and is in agreement with them.  He knows to contact our office if he develops concerns prior to his next appointment.     I provided 15 minutes of face-to-face time during this encounter and > 50% was spent counseling as documented under my assessment and plan.     Nillie Bartolotta A Jakirah Zaun, PA-C  [facility] [place] - A DEPT OF [facility] [address] [place] [address] Dept: [phone] Dept Fax: [phone]   No orders of the defined types were placed in this encounter.

## 2023-08-23 ENCOUNTER — Other Ambulatory Visit: Payer: Self-pay | Admitting: Cardiology

## 2023-11-22 ENCOUNTER — Other Ambulatory Visit: Payer: Self-pay | Admitting: Cardiology

## 2023-12-04 ENCOUNTER — Ambulatory Visit (HOSPITAL_COMMUNITY)
Admission: RE | Admit: 2023-12-04 | Discharge: 2023-12-04 | Disposition: A | Discharge status: Home / Self Care | Source: Ambulatory Visit | Attending: Urology | Admitting: Urology

## 2023-12-04 ENCOUNTER — Other Ambulatory Visit (HOSPITAL_COMMUNITY): Payer: Self-pay | Admitting: Urology

## 2023-12-04 DIAGNOSIS — C642 Malignant neoplasm of left kidney, except renal pelvis: Secondary | ICD-10-CM | POA: Insufficient documentation

## 2023-12-21 ENCOUNTER — Other Ambulatory Visit: Payer: Self-pay | Admitting: Cardiology

## 2023-12-31 ENCOUNTER — Telehealth: Payer: Self-pay | Admitting: Cardiology

## 2023-12-31 NOTE — Telephone Encounter (Signed)
 Called pt and asked if he could come in for fasting cholesterol blood work per the last lab note from Dr. Bernie. Pt will come in to get labs within the next 2 days to see if the Atorvastatin  is helping. Once we get results of those labs, will reassess if this medication is working for pt. Pt verbalized understanding and had no further questions.

## 2023-12-31 NOTE — Telephone Encounter (Signed)
*  STAT* If patient is at the pharmacy, call can be transferred to refill team.   1. Which medications need to be refilled? (please list name of each medication and dose if known)   atorvastatin  (LIPITOR) 20 MG tablet    2. Which pharmacy/location (including street and city if local pharmacy) is medication to be sent to?  [facility] - [place], [place] - [address]      3. Do they need a 30 day or 90 day supply? 90 day  Pt has office visit scheduled.

## 2024-01-01 ENCOUNTER — Other Ambulatory Visit: Payer: Self-pay | Admitting: Cardiology

## 2024-01-02 LAB — LIPID PANEL
Chol/HDL Ratio: 4.6 ratio (ref 0.0–5.0)
Cholesterol, Total: 114 mg/dL (ref 100–199)
HDL: 25 mg/dL — ABNORMAL LOW (ref >39)
LDL Chol Calc (NIH): 59 mg/dL (ref 0–99)
Triglycerides: 176 mg/dL — ABNORMAL HIGH (ref 0–149)
VLDL Cholesterol Cal: 30 mg/dL (ref 5–40)

## 2024-01-02 LAB — ALT: ALT: 19 IU/L (ref 0–44)

## 2024-01-02 LAB — AST: AST: 22 IU/L (ref 0–40)

## 2024-01-11 ENCOUNTER — Telehealth: Payer: Self-pay | Admitting: Cardiology

## 2024-01-11 MED ORDER — ATORVASTATIN CALCIUM 20 MG PO TABS: 20.0000 mg | ORAL_TABLET | Freq: Every day | ORAL | 0 refills | Status: DC

## 2024-01-11 NOTE — Telephone Encounter (Signed)
*  STAT* If patient is at the pharmacy, call can be transferred to refill team.   1. Which medications need to be refilled? (please list name of each medication and dose if known)  atorvastatin  (LIPITOR) 20 MG tablet    2. Which pharmacy/location (including street and city if local pharmacy) is medication to be sent to? [facility] - [place], [place] - [address]    3. Do they need a 30 day or 90 day supply?  90 day supply

## 2024-01-11 NOTE — Telephone Encounter (Signed)
 Refills has been sent to the pharmacy.

## 2024-01-15 ENCOUNTER — Ambulatory Visit: Payer: Self-pay | Admitting: Cardiology

## 2024-01-16 ENCOUNTER — Other Ambulatory Visit: Payer: Self-pay | Admitting: Gastroenterology

## 2024-02-28 ENCOUNTER — Telehealth: Payer: Self-pay | Admitting: Cardiology

## 2024-02-28 NOTE — Telephone Encounter (Signed)
 Patient came by asking for us  to be on the look out for a surgical clearance form from [facility]. CB # [phone]

## 2024-02-29 ENCOUNTER — Telehealth: Payer: Self-pay

## 2024-02-29 NOTE — Telephone Encounter (Signed)
   Name: Justin Cross  DOB: 07/03/1956  MRN: [id number]  Primary Cardiologist: None  Chart reviewed as part of pre-operative protocol coverage. Because of Adric Wrede Beeghly's past medical history and time since last visit, he will require a follow-up in-office visit in order to better assess preoperative cardiovascular risk. Last seen by Dr. Krasowski on 10/26/2022.    Pre-op covering staff: - Please schedule appointment and call patient to inform them. If patient already had an upcoming appointment within acceptable timeframe, please add pre-op clearance to the appointment notes so provider is aware. - Please contact requesting surgeon's office via preferred method (i.e, phone, fax) to inform them of need for appointment prior to surgery.    Lamarr Satterfield, NP  02/29/2024, 9:53 AM

## 2024-02-29 NOTE — Telephone Encounter (Signed)
   Pre-operative Risk Assessment    Patient Name: Justin Cross  DOB: [birth year] MRN: [id number]   Date of last office visit: 10/26/22 LAMAR FITCH, MD Date of next office visit: 03/12/24 LAMAR FITCH, MD   Request for Surgical Clearance    Procedure:  LEFT KNEE SCOPE MEDIAL MENISECTOMY  Date of Surgery:  Clearance TBD                                Surgeon:  EVALENE CHANCY, MD Surgeon's Group or Practice Name:  [facility] Phone number:  [phone] Fax number:  [phone]   Type of Clearance Requested:   - Medical    Type of Anesthesia:  CHOICE   Additional requests/questions:    Bonney Lucie DELENA Alvia   02/29/2024, 9:46 AM

## 2024-02-29 NOTE — Telephone Encounter (Signed)
 Pt has appt with Dr. Krasowski for 12 month f/u appt 03/12/24.

## 2024-03-11 ENCOUNTER — Other Ambulatory Visit: Payer: Self-pay

## 2024-03-11 DIAGNOSIS — I10 Essential (primary) hypertension: Secondary | ICD-10-CM | POA: Insufficient documentation

## 2024-03-11 DIAGNOSIS — M1990 Unspecified osteoarthritis, unspecified site: Secondary | ICD-10-CM | POA: Insufficient documentation

## 2024-03-11 DIAGNOSIS — M479 Spondylosis, unspecified: Secondary | ICD-10-CM | POA: Insufficient documentation

## 2024-03-12 ENCOUNTER — Ambulatory Visit: Attending: Cardiology | Admitting: Cardiology

## 2024-03-12 ENCOUNTER — Encounter: Payer: Self-pay | Admitting: Cardiology

## 2024-03-12 VITALS — BP 126/76 | HR 70 | Ht 74.0 in | Wt 324.2 lb

## 2024-03-12 DIAGNOSIS — G4733 Obstructive sleep apnea (adult) (pediatric): Secondary | ICD-10-CM | POA: Diagnosis not present

## 2024-03-12 DIAGNOSIS — R0789 Other chest pain: Secondary | ICD-10-CM

## 2024-03-12 DIAGNOSIS — Z01810 Encounter for preprocedural cardiovascular examination: Secondary | ICD-10-CM | POA: Insufficient documentation

## 2024-03-12 DIAGNOSIS — I10 Essential (primary) hypertension: Secondary | ICD-10-CM

## 2024-03-12 DIAGNOSIS — E782 Mixed hyperlipidemia: Secondary | ICD-10-CM

## 2024-03-12 NOTE — Patient Instructions (Signed)
 Medication Instructions:  Your physician recommends that you continue on your current medications as directed. Please refer to the Current Medication list given to you today.  *If you need a refill on your cardiac medications before your next appointment, please call your pharmacy*   Lab Work: None Ordered If you have labs (blood work) drawn today and your tests are completely normal, you will receive your results only by: MyChart Message (if you have MyChart) OR A paper copy in the mail If you have any lab test that is abnormal or we need to change your treatment, we will call you to review the results.   Testing/Procedures: Your physician has requested that you have a lexiscan  myoview . For further information please visit [email or web address] Please follow instruction sheet, as given.  The test will take approximately 3 to 4 hours to complete; you may bring reading material.  If someone comes with you to your appointment, they will need to remain in the main lobby due to limited space in the testing area.   How to prepare for your Myocardial Perfusion Test: Do not eat or drink 3 hours prior to your test, except you may have water . Do not consume products containing caffeine (regular or decaffeinated) 12 hours prior to your test. (ex: coffee, chocolate, sodas, tea). Do bring a list of your current medications with you.  If not listed below, you may take your medications as normal. Do wear comfortable clothes (no dresses or overalls) and walking shoes, tennis shoes preferred (No heels or open toe shoes are allowed). Do NOT wear cologne, perfume, aftershave, or lotions (deodorant is allowed). If these instructions are not followed, your test will have to be rescheduled.    Your physician has requested that you have an echocardiogram. Echocardiography is a painless test that uses sound waves to create images of your heart. It provides your doctor with information about the size and shape of  your heart and how well your heart's chambers and valves are working. This procedure takes approximately one hour. There are no restrictions for this procedure. Please do NOT wear cologne, perfume, aftershave, or lotions (deodorant is allowed). Please arrive 15 minutes prior to your appointment time.  Please note: We ask at that you not bring children with you during ultrasound (echo/ vascular) testing. Due to room size and safety concerns, children are not allowed in the ultrasound rooms during exams. Our front office staff cannot provide observation of children in our lobby area while testing is being conducted. An adult accompanying a patient to their appointment will only be allowed in the ultrasound room at the discretion of the ultrasound technician under special circumstances. We apologize for any inconvenience.    Follow-Up: At [facility], you and your health needs are our priority.  As part of our continuing mission to provide you with exceptional heart care, we have created designated Provider Care Teams.  These Care Teams include your primary Cardiologist (physician) and Advanced Practice Providers (APPs -  Physician Assistants and Nurse Practitioners) who all work together to provide you with the care you need, when you need it.  We recommend signing up for the patient portal called MyChart.  Sign up information is provided on this After Visit Summary.  MyChart is used to connect with patients for Virtual Visits (Telemedicine).  Patients are able to view lab/test results, encounter notes, upcoming appointments, etc.  Non-urgent messages can be sent to your provider as well.   To learn more about what you can  do with MyChart, go to [email or web address].    Your next appointment:   6 month(s)  The format for your next appointment:   In Person  Provider:   Ralene Burger, MD    Other Instructions NA

## 2024-03-12 NOTE — Progress Notes (Signed)
 Cardiology Office Note:    Date:  03/12/2024   ID:  Justin, Cross September 05, 1955, MRN [id number]  PCP:  Street, Lonni HERO, MD  Cardiologist:  Lamar Fitch, MD    Referring MD: [address] Lonni HERO, *   No chief complaint on file.   History of Present Illness:    Justin Cross is a 68 y.o. male past medical history significant for essential hypertension, status post nephrectomy secondary to renal cancer, morbid obesity, hyperlipidemia, sleep apnea, palpitation, low normal ejection fraction.  Comes today to months for follow-up.  He does have problem with a left knee meniscus issue and he required surgery.  Denies have any chest pain tightness squeezing pressure mid chest but his ability to exercise is limited because of knee problems.  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   Dyslipidemia    Dyspnea on exertion 05/17/2020   Dysrhythmia    racing heart   GERD (gastroesophageal reflux disease)    High blood pressure    Hyperlipidemia    Hypertension    Leg pain    Lumbosacral plexopathy    12/30/19 - estimates 18-20 years   Muscle spasm 12/30/2019   Nonspecific abnormal electrocardiogram (ECG) (EKG) 10/26/2022   Obesity    OSA (obstructive sleep apnea)    Pre-diabetes    Renal mass 06/09/2020   Restless leg    Restless leg syndrome 03/01/2020   Seasonal allergies    Hay fever   Sexual dysfunction    Sleep apnea    Spondylosis    lumbar   Urinary problem     Past Surgical History:  Procedure Laterality Date   ANAL FISSURE REPAIR  1993?   NOSE SURGERY  1977/78   Realignment   ROBOT ASSISTED LAPAROSCOPIC NEPHRECTOMY Left  06/09/2020   Procedure: XI ROBOTIC ASSISTED LAPAROSCOPIC RADICAL NEPHRECTOMY;  Surgeon: Alvaro Hummer, MD;  Location: [facility];  Service: Urology;  Laterality: Left;  3 HRS   SKIN CANCER EXCISION     Basal cell X3    Current Medications: Current Meds  Medication Sig   acetaminophen  (TYLENOL ) 500 MG tablet Take 1,000 mg by mouth every 6 (six) hours as needed for mild pain or headache.   Ascorbic Acid (VITA-C PO) Take 1 tablet by mouth daily.   atorvastatin  (LIPITOR) 20 MG tablet Take 1 tablet (20 mg total) by mouth daily. KEEP OV.   azelastine (ASTELIN) 0.1 % nasal spray Place 1 spray into both nostrils 2 (two) times daily.   baclofen (LIORESAL) 10 MG tablet Take 5-10 mg by mouth 2 (two) times daily as needed for muscle spasms.   famotidine  (PEPCID ) 40 MG tablet Take 40 mg by mouth daily.   Flaxseed, Linseed, (FLAXSEED OIL PO) Take 1 capsule by mouth daily.   Omega-3 Fatty Acids (FISH OIL PO) Take 1 capsule by mouth daily.   potassium chloride  (MICRO-K ) 10 MEQ CR capsule Take 10 mEq by mouth daily.   pregabalin (LYRICA) 75 MG capsule Take 75 mg by mouth 2 (two) times daily as needed.   RABEprazole  (ACIPHEX ) 20 MG tablet Take 1 tablet (20 mg total) by mouth daily.  telmisartan-hydrochlorothiazide (MICARDIS HCT) 40-12.5 MG tablet Take 1 tablet by mouth daily.   traMADol  (ULTRAM ) 50 MG tablet Take 50 mg by mouth every 6 (six) hours as needed.     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   Left-handed.   Caffeine: 1/3 of a 2-liter bottle of Diet Mountain Dew per day.   Social Drivers of Corporate investment banker Strain: Not on file  Food Insecurity: Not on file  Transportation Needs: Not on file  Physical Activity: Not on file  Stress: Not on file  Social Connections: Unknown (11/15/2021)   Received from [employer]   Social Network    Social Network: Not on file     Family History: The patient's family history includes Diabetes in his brother and mother; Heart disease in his father; Hypertension in his mother; Liver cancer in his brother; Prostate cancer in his brother and father. There is no history of Coronary artery disease, Colon cancer, Stomach cancer, Esophageal cancer, or Pancreatic cancer. ROS:   Please see the history of present illness.    All 14 point review of systems negative except as described per history of present illness  EKGs/Labs/Other Studies Reviewed:    EKG Interpretation Date/Time:  Wednesday March 12 2024 11:20:40 EDT Ventricular Rate:  70 PR Interval:  170 QRS Duration:  100 QT Interval:  372 QTC Calculation: 401 R Axis:   -29  Text Interpretation: Normal sinus rhythm Normal ECG No previous ECGs available Confirmed by Bernie Charleston [phone]) on 03/12/2024 11:28:47 AM    Recent Labs: 08/08/2023: Hemoglobin 14.2; Platelet Count 132 01/01/2024: ALT 19  Recent Lipid Panel    Component Value Date/Time   CHOL 114 01/01/2024 0835   TRIG 176 (H) 01/01/2024 0835   HDL 25 (L) 01/01/2024 0835   CHOLHDL 4.6 01/01/2024 0835   LDLCALC 59 01/01/2024 0835    Physical Exam:    VS:  BP 126/76   Pulse 70   Ht 6' 2 (1.88 m)   Wt (!)  324 lb 3.2 oz (147.1 kg)   SpO2 96%   BMI 41.62 kg/m     Wt Readings from Last 3 Encounters:  03/12/24 (!) 324 lb 3.2 oz (147.1 kg)  08/08/23 (!) 315 lb 14.4 oz (143.3 kg)  01/03/23 (!) 302 lb (137 kg)     GEN:  Well nourished, well developed in no acute distress HEENT: Normal NECK: No JVD; No carotid bruits LYMPHATICS: No lymphadenopathy CARDIAC: RRR, no murmurs, no rubs, no gallops RESPIRATORY:  Clear to auscultation  without rales, wheezing or rhonchi  ABDOMEN: Soft, non-tender, non-distended MUSCULOSKELETAL:  No edema; No deformity  SKIN: Warm and dry LOWER EXTREMITIES: no swelling NEUROLOGIC:  Alert and oriented x 3 PSYCHIATRIC:  Normal affect   ASSESSMENT:    1. Primary hypertension   2. OSA (obstructive sleep apnea)   3. Atypical chest pain   4. Mixed hyperlipidemia   5. Preop cardiovascular exam    PLAN:    In order of problems listed above:  Cardiovascular preop evaluation.  The surgery overall is considered low risk, probably can be general as well as T0 which does carry some risk on top of that he is unable to achieve 4 METS because of knee problem, with history of low normal ejection fraction we will schedule him to have echocardiogram done to assess left ventricle ejection fraction as well as Lexiscan .  If both were negative and he was ready to proceed to surgery with no difficulties. Obstructive sleep apnea to be followed by antimedicine team. Atypical chest pain denies having any. Dyslipidemia he is on Lipitor 20 which I will continue, I did review K PN which show me data from 01/01/2024 with LDL 59 HDL 25 we will continue present management   Medication Adjustments/Labs and Tests Ordered: Current medicines are reviewed at length with the patient today.  Concerns regarding medicines are outlined above.  Orders Placed This Encounter  Procedures   EKG 12-Lead   Medication changes: No orders of the defined types were placed in this  encounter.   Signed, Lamar DOROTHA Fitch, MD, [facility] 03/12/2024 11:41 AM    [facility]

## 2024-03-12 NOTE — Addendum Note (Signed)
 Addended by: ARLOA PLANAS D on: 03/12/2024 11:46 AM   Modules accepted: Orders

## 2024-04-07 ENCOUNTER — Other Ambulatory Visit: Payer: Self-pay | Admitting: Cardiology

## 2024-04-08 ENCOUNTER — Ambulatory Visit: Attending: Cardiology

## 2024-04-16 ENCOUNTER — Encounter: Payer: Self-pay | Admitting: *Deleted

## 2024-04-29 ENCOUNTER — Ambulatory Visit: Attending: Cardiology

## 2024-04-29 DIAGNOSIS — Z01810 Encounter for preprocedural cardiovascular examination: Secondary | ICD-10-CM

## 2024-04-29 MED ORDER — REGADENOSON 0.4 MG/5ML IV SOLN
0.4000 mg | Freq: Once | INTRAVENOUS | Status: AC
  Administered 2024-04-29: 0.4 mg via INTRAVENOUS

## 2024-04-29 MED ORDER — TECHNETIUM TC 99M TETROFOSMIN IV KIT
32.7000 | PACK | Freq: Once | INTRAVENOUS | Status: AC | PRN
  Administered 2024-04-29: 32.7 via INTRAVENOUS

## 2024-04-30 ENCOUNTER — Ambulatory Visit: Payer: Self-pay | Admitting: Cardiology

## 2024-04-30 ENCOUNTER — Ambulatory Visit: Attending: Cardiology

## 2024-04-30 ENCOUNTER — Ambulatory Visit (INDEPENDENT_AMBULATORY_CARE_PROVIDER_SITE_OTHER)

## 2024-04-30 DIAGNOSIS — Z01810 Encounter for preprocedural cardiovascular examination: Secondary | ICD-10-CM | POA: Diagnosis not present

## 2024-04-30 LAB — MYOCARDIAL PERFUSION IMAGING
LV dias vol: 126 mL (ref 62–150)
LV sys vol: 53 mL (ref 4.2–5.8)
Nuc Stress EF: 58 %
Peak HR: 96 {beats}/min
Rest HR: 70 {beats}/min
Rest Nuclear Isotope Dose: 31.4 mCi
SDS: 3
SRS: 4
SSS: 7
ST Depression (mm): 0 mm
Stress Nuclear Isotope Dose: 32.7 mCi
TID: 1.05

## 2024-04-30 LAB — ECHOCARDIOGRAM COMPLETE
Area-P 1/2: 3.39 cm2
S' Lateral: 4 cm

## 2024-04-30 MED ORDER — TECHNETIUM TC 99M TETROFOSMIN IV KIT
31.4000 | PACK | Freq: Once | INTRAVENOUS | Status: AC | PRN
  Administered 2024-04-30: 31.4 via INTRAVENOUS

## 2024-05-05 ENCOUNTER — Telehealth: Payer: Self-pay

## 2024-05-05 NOTE — Telephone Encounter (Signed)
 Stress Test Results reviewed with pt as per Dr. Karry note.  Pt verbalized understanding and had no additional questions. Routed to PCP

## 2024-05-06 NOTE — Telephone Encounter (Signed)
   Primary Cardiologist: None  Chart reviewed as part of pre-operative protocol coverage. Given past medical history and time since last visit, based on ACC/AHA guidelines, Justin Cross would be at acceptable risk for the planned procedure without further cardiovascular testing.   Patient should contact our office if he is having new symptoms that are concerning from a cardiac perspective to arrange a follow-up appointment.    I will route this recommendation to the requesting party via Epic fax function and remove from pre-op pool.  Please call with questions.  Justin EMERSON Bane, NP-C 05/06/2024, 11:27 AM [address], [address] [place], [place] [address] Office [phone] Fax [phone]
